# Patient Record
Sex: Female | Born: 1945 | Race: White | Hispanic: No | Marital: Married | Smoking: Former smoker
Health system: Southern US, Community
[De-identification: ages and names within clinical notes are randomized; demographics above are authoritative.]

## PROBLEM LIST (undated history)

## (undated) DIAGNOSIS — K5790 Diverticulosis of intestine, part unspecified, without perforation or abscess without bleeding: Secondary | ICD-10-CM

## (undated) DIAGNOSIS — I10 Essential (primary) hypertension: Secondary | ICD-10-CM

## (undated) DIAGNOSIS — R1900 Intra-abdominal and pelvic swelling, mass and lump, unspecified site: Secondary | ICD-10-CM

## (undated) DIAGNOSIS — K219 Gastro-esophageal reflux disease without esophagitis: Secondary | ICD-10-CM

## (undated) HISTORY — DX: Essential (primary) hypertension: I10

## (undated) HISTORY — DX: Gastro-esophageal reflux disease without esophagitis: K21.9

---
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
## 1971-02-25 HISTORY — PX: TUBAL LIGATION: SHX77

## 1985-02-24 HISTORY — PX: CHOLECYSTECTOMY: SHX55

## 1988-10-25 HISTORY — PX: HAND SURGERY: SHX662

## 1997-12-26 ENCOUNTER — Other Ambulatory Visit: Admission: RE | Admit: 1997-12-26 | Discharge: 1997-12-26 | Payer: Self-pay | Admitting: Gynecology

## 1999-05-20 ENCOUNTER — Other Ambulatory Visit: Admission: RE | Admit: 1999-05-20 | Discharge: 1999-05-20 | Payer: Self-pay | Admitting: Gynecology

## 2000-03-06 ENCOUNTER — Encounter: Admission: RE | Admit: 2000-03-06 | Discharge: 2000-03-06 | Payer: Self-pay | Admitting: Cardiovascular Disease

## 2000-03-06 ENCOUNTER — Encounter: Payer: Self-pay | Admitting: Cardiovascular Disease

## 2000-04-17 ENCOUNTER — Encounter: Payer: Self-pay | Admitting: Cardiovascular Disease

## 2000-04-17 ENCOUNTER — Encounter: Admission: RE | Admit: 2000-04-17 | Discharge: 2000-04-17 | Payer: Self-pay | Admitting: Cardiovascular Disease

## 2000-12-15 ENCOUNTER — Encounter: Payer: Self-pay | Admitting: Cardiovascular Disease

## 2000-12-15 ENCOUNTER — Encounter: Admission: RE | Admit: 2000-12-15 | Discharge: 2000-12-15 | Payer: Self-pay | Admitting: Cardiovascular Disease

## 2004-05-22 ENCOUNTER — Observation Stay (HOSPITAL_COMMUNITY): Admission: EM | Admit: 2004-05-22 | Discharge: 2004-05-23 | Payer: Self-pay | Admitting: Emergency Medicine

## 2004-06-13 ENCOUNTER — Encounter (HOSPITAL_COMMUNITY): Admission: RE | Admit: 2004-06-13 | Discharge: 2004-09-11 | Payer: Self-pay | Admitting: *Deleted

## 2010-03-17 ENCOUNTER — Encounter: Payer: Self-pay | Admitting: *Deleted

## 2015-09-14 ENCOUNTER — Ambulatory Visit: Payer: Medicare Other | Attending: Gynecology | Admitting: Gynecology

## 2015-09-14 ENCOUNTER — Other Ambulatory Visit (HOSPITAL_COMMUNITY)
Admission: RE | Admit: 2015-09-14 | Discharge: 2015-09-14 | Disposition: A | Discharge status: Home / Self Care | Payer: Medicare Other | Source: Ambulatory Visit | Attending: Gynecology | Admitting: Gynecology

## 2015-09-14 ENCOUNTER — Encounter: Payer: Self-pay | Admitting: Gynecology

## 2015-09-14 VITALS — BP 150/72 | HR 102 | Temp 97.5°F | Resp 18 | Ht 65.0 in | Wt 267.3 lb

## 2015-09-14 DIAGNOSIS — R102 Pelvic and perineal pain: Secondary | ICD-10-CM | POA: Diagnosis not present

## 2015-09-14 DIAGNOSIS — Z1151 Encounter for screening for human papillomavirus (HPV): Secondary | ICD-10-CM | POA: Insufficient documentation

## 2015-09-14 DIAGNOSIS — R1900 Intra-abdominal and pelvic swelling, mass and lump, unspecified site: Secondary | ICD-10-CM | POA: Insufficient documentation

## 2015-09-14 DIAGNOSIS — I10 Essential (primary) hypertension: Secondary | ICD-10-CM | POA: Diagnosis not present

## 2015-09-14 DIAGNOSIS — R87619 Unspecified abnormal cytological findings in specimens from cervix uteri: Secondary | ICD-10-CM | POA: Diagnosis not present

## 2015-09-14 DIAGNOSIS — Z01411 Encounter for gynecological examination (general) (routine) with abnormal findings: Secondary | ICD-10-CM | POA: Insufficient documentation

## 2015-09-14 DIAGNOSIS — Z87891 Personal history of nicotine dependence: Secondary | ICD-10-CM | POA: Insufficient documentation

## 2015-09-14 DIAGNOSIS — Z9049 Acquired absence of other specified parts of digestive tract: Secondary | ICD-10-CM | POA: Insufficient documentation

## 2015-09-14 DIAGNOSIS — R918 Other nonspecific abnormal finding of lung field: Secondary | ICD-10-CM | POA: Insufficient documentation

## 2015-09-14 DIAGNOSIS — K219 Gastro-esophageal reflux disease without esophagitis: Secondary | ICD-10-CM | POA: Diagnosis not present

## 2015-09-14 DIAGNOSIS — R590 Localized enlarged lymph nodes: Secondary | ICD-10-CM | POA: Insufficient documentation

## 2015-09-14 DIAGNOSIS — Z880 Allergy status to penicillin: Secondary | ICD-10-CM | POA: Insufficient documentation

## 2015-09-14 DIAGNOSIS — Z881 Allergy status to other antibiotic agents status: Secondary | ICD-10-CM | POA: Diagnosis not present

## 2015-09-14 DIAGNOSIS — Z809 Family history of malignant neoplasm, unspecified: Secondary | ICD-10-CM | POA: Insufficient documentation

## 2015-09-14 NOTE — Addendum Note (Signed)
Addended by: Elizebeth Koller on: 09/14/2015 01:54 PM   Modules accepted: Orders

## 2015-09-14 NOTE — Progress Notes (Signed)
Consult Note: Gyn-Onc   Catherine Russell 70 y.o. female  Chief Complaint  Patient presents with  . Pelvic mass in female    New consultation    Assessment :Advanced gynecologic cancer (diagnosis pending), moderate to severe pelvic pain.  Plan: I have reviewed the CT findings with the patient and her daughter. Pap smears obtained. We will schedule the patient to have interventional radiology obtain a core biopsy of the pelvic mass or other accessible masses such as lymph nodes or omentum. In the interim, to control pain, the patient interpretation for OxyContin 10 mg every 12 hours and oxycodone 5 mg every 6 hours when necessary pain.     HPI: 70 year old white female gravida 3 para 3 seen in consultation at the request of Dr. 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  The patient reports that she's had intermittent bleeding for the last several years. The last 5 months she has not had any bleeding. However, for the last several months she has had ever increasing pelvic pain. The patient was seen by gastroenterologist to obtained a CT scan of the abdomen and pelvis that showed significant abnormalities including a 14 x 10 cm central pelvic mass involving the uterus and extending into the right adnexa. In addition, the patient has retroperitoneal adenopathy in the periaortic and pelvic region. The largest lymph node is in the left para-aortic region measuring 3.4 cm. She has bilateral iliac lymphadenopathy with a right external iliac node measuring 2.3 cm. Further, the patient has several sub-centimeter pulmonary nodules in both lung bases the largest of which measures 7 mm. There is noted to be mild left hydronephrosis and diffuse right renal parenchymal atrophy. Tumor markers reveal an elevated CA will 125 252 units per mL and CEA of 37.6 units. The patient denies any past gynecologic history although she has not had much medical care in  a number of years.  Today she is not having any bleeding all a she seems to be in moderate to severe pain in the lower abdomen and pelvis. She comes accompanied by her youngest daughter.  Review of Systems:10 point review of systems is negative except as noted in interval history.   Vitals: Blood pressure 150/72, pulse 102, temperature 97.5 F (36.4 C), temperature source Oral, resp. rate 18, height 5\' 5"  (1.651 m), weight 267 lb 4.8 oz (121.246 kg), SpO2 97 %.  Physical Exam: General : The patient is in moderate distress from pain. She is tearful and rocking on the bed.Catherine Russell  HEENT: normocephalic, extraoccular movements normal; neck is supple without thyromegally  Lynphnodes: Supraclavicular and inguinal nodes not enlarged  Abdomen: Obese, Soft, no ascites, no organomegally, no masses, no hernias there is moderate discomfort to deep palpation in the lower abdomen. Pelvic:  EGBUS: Normal female  Vagina: Normal, no lesions  Urethra and Bladder: Normal, non-tender  Cervix: I am unable to visualize or palpate the cervix Uterus: Unable to assess secondary to the patient's habitus. Bi-manual examination: Non-tender; no adenxal masses or nodularity  Rectal: normal sphincter tone, no masses, no blood  Lower extremities: No edema or varicosities. Normal range of motion      Allergies  Allergen Reactions  . Doxycycline Other (See Comments)    Blisters  . Erythromycin Other (See Comments)    Blisters   . Penicillins Other (See Comments)    Blisters    Past Medical History  Diagnosis Date  . GERD (gastroesophageal reflux disease)   .  Hypertension     Past Surgical History  Procedure Laterality Date  . Appendectomy  1966  . Cholecystectomy  1987  . Tubal ligation  1973  . Hand surgery  1990's  . Tonsillectomy  1953    Current Outpatient Prescriptions  Medication Sig Dispense Refill  . meloxicam (MOBIC) 15 MG tablet Take 15 mg by mouth every other day.    Catherine Russell omeprazole  (PRILOSEC) 20 MG capsule Take 20 mg by mouth daily.    Catherine Russell oxyCODONE-acetaminophen (PERCOCET/ROXICET) 5-325 MG tablet Take 1 tablet by mouth every 4 (four) hours as needed for moderate pain or severe pain.    . valsartan-hydrochlorothiazide (DIOVAN-HCT) 320-25 MG tablet Take 1 tablet by mouth daily.     No current facility-administered medications for this visit.    Social History   Social History  . Marital Status: Married    Spouse Name: N/A  . Number of Children: N/A  . Years of Education: N/A   Occupational History  . Not on file.   Social History Main Topics  . Smoking status: Former Smoker    Quit date: 03/13/1995  . Smokeless tobacco: Not on file  . Alcohol Use: No  . Drug Use: No  . Sexual Activity: Not on file   Other Topics Concern  . Not on file   Social History Narrative  . No narrative on file    Family History  Problem Relation Age of Onset  . Cancer Mother   . Hypertension Mother   . Heart attack Mother   . Heart attack Father   . Cancer Brother   . Diabetes Brother   . Hypertension Brother   . Stroke Brother       Marti Sleigh, MD 09/14/2015, 1:13 PM

## 2015-09-14 NOTE — Patient Instructions (Signed)
You will receive a phone call from the Hospital to arrange for you to have a biopsy.  We will discuss a plan once we have the biopsy results.  Take the oxycontin every 12 hours and use the oxycodone immediate release as needed throughout the day.  Please call for any questions or concerns.

## 2015-09-17 ENCOUNTER — Telehealth: Payer: Self-pay

## 2015-09-17 NOTE — Telephone Encounter (Signed)
Patient's daughter Catherine Russell called to update Dr Marti Sleigh that her mother has been experiencing pain even though she had been taking Oxycotin 10 MG PO every 12 hours and Oxycodone 5 mg po every 6 hours PRN. States her mother initiated the Oxycontin 10 mg po every 12 hours Friday 7/21, 2017 evening. Catherine Russell, APNP updated , orders received to have the patient take two tablets of the Oxycodone 5 mg po every six hours , PRN and call tomorrow to update [place]. Catherine Russell was updated and states understanding , will call tomorrow to update on her mother's pain status. Catherine Russell instructed to call with any additional changes, questions or concerns.

## 2015-09-18 ENCOUNTER — Telehealth: Payer: Self-pay | Admitting: Gynecologic Oncology

## 2015-09-18 ENCOUNTER — Other Ambulatory Visit: Payer: Self-pay | Admitting: Gynecologic Oncology

## 2015-09-18 NOTE — Telephone Encounter (Signed)
Returned call to patient's daughter.  Left message asking her to please call the office so adjustments in her mother's pain medication can be discussed.

## 2015-09-19 ENCOUNTER — Other Ambulatory Visit: Payer: Self-pay | Admitting: General Surgery

## 2015-09-19 ENCOUNTER — Emergency Department (HOSPITAL_COMMUNITY): Payer: Medicare Other

## 2015-09-19 ENCOUNTER — Encounter (HOSPITAL_COMMUNITY): Payer: Self-pay

## 2015-09-19 ENCOUNTER — Inpatient Hospital Stay (HOSPITAL_COMMUNITY)
Admission: EM | Admit: 2015-09-19 | Discharge: 2015-09-21 | DRG: 683 | Disposition: A | Discharge status: Hospice | Payer: Medicare Other | Attending: Internal Medicine | Admitting: Internal Medicine

## 2015-09-19 DIAGNOSIS — Z6841 Body Mass Index (BMI) 40.0 and over, adult: Secondary | ICD-10-CM

## 2015-09-19 DIAGNOSIS — Z66 Do not resuscitate: Secondary | ICD-10-CM | POA: Diagnosis present

## 2015-09-19 DIAGNOSIS — N179 Acute kidney failure, unspecified: Principal | ICD-10-CM | POA: Diagnosis present

## 2015-09-19 DIAGNOSIS — C799 Secondary malignant neoplasm of unspecified site: Secondary | ICD-10-CM | POA: Diagnosis present

## 2015-09-19 DIAGNOSIS — R34 Anuria and oliguria: Secondary | ICD-10-CM | POA: Diagnosis present

## 2015-09-19 DIAGNOSIS — Z79899 Other long term (current) drug therapy: Secondary | ICD-10-CM

## 2015-09-19 DIAGNOSIS — E875 Hyperkalemia: Secondary | ICD-10-CM | POA: Diagnosis present

## 2015-09-19 DIAGNOSIS — R1900 Intra-abdominal and pelvic swelling, mass and lump, unspecified site: Secondary | ICD-10-CM | POA: Diagnosis present

## 2015-09-19 DIAGNOSIS — N19 Unspecified kidney failure: Secondary | ICD-10-CM

## 2015-09-19 DIAGNOSIS — Z880 Allergy status to penicillin: Secondary | ICD-10-CM

## 2015-09-19 DIAGNOSIS — Z823 Family history of stroke: Secondary | ICD-10-CM

## 2015-09-19 DIAGNOSIS — Z833 Family history of diabetes mellitus: Secondary | ICD-10-CM

## 2015-09-19 DIAGNOSIS — M7070 Other bursitis of hip, unspecified hip: Secondary | ICD-10-CM | POA: Diagnosis present

## 2015-09-19 DIAGNOSIS — N189 Chronic kidney disease, unspecified: Secondary | ICD-10-CM | POA: Diagnosis present

## 2015-09-19 DIAGNOSIS — Z8249 Family history of ischemic heart disease and other diseases of the circulatory system: Secondary | ICD-10-CM

## 2015-09-19 DIAGNOSIS — I129 Hypertensive chronic kidney disease with stage 1 through stage 4 chronic kidney disease, or unspecified chronic kidney disease: Secondary | ICD-10-CM | POA: Diagnosis present

## 2015-09-19 DIAGNOSIS — K219 Gastro-esophageal reflux disease without esophagitis: Secondary | ICD-10-CM

## 2015-09-19 DIAGNOSIS — Z87891 Personal history of nicotine dependence: Secondary | ICD-10-CM

## 2015-09-19 DIAGNOSIS — Z79891 Long term (current) use of opiate analgesic: Secondary | ICD-10-CM

## 2015-09-19 DIAGNOSIS — N390 Urinary tract infection, site not specified: Secondary | ICD-10-CM | POA: Diagnosis present

## 2015-09-19 DIAGNOSIS — Z809 Family history of malignant neoplasm, unspecified: Secondary | ICD-10-CM

## 2015-09-19 DIAGNOSIS — R531 Weakness: Secondary | ICD-10-CM | POA: Diagnosis not present

## 2015-09-19 DIAGNOSIS — Z9049 Acquired absence of other specified parts of digestive tract: Secondary | ICD-10-CM

## 2015-09-19 DIAGNOSIS — Z515 Encounter for palliative care: Secondary | ICD-10-CM | POA: Diagnosis present

## 2015-09-19 DIAGNOSIS — E669 Obesity, unspecified: Secondary | ICD-10-CM | POA: Diagnosis present

## 2015-09-19 DIAGNOSIS — C763 Malignant neoplasm of pelvis: Secondary | ICD-10-CM | POA: Diagnosis present

## 2015-09-19 DIAGNOSIS — Z881 Allergy status to other antibiotic agents status: Secondary | ICD-10-CM

## 2015-09-19 DIAGNOSIS — N1330 Unspecified hydronephrosis: Secondary | ICD-10-CM | POA: Diagnosis present

## 2015-09-19 HISTORY — DX: Diverticulosis of intestine, part unspecified, without perforation or abscess without bleeding: K57.90

## 2015-09-19 HISTORY — DX: Intra-abdominal and pelvic swelling, mass and lump, unspecified site: R19.00

## 2015-09-19 LAB — URINALYSIS, ROUTINE W REFLEX MICROSCOPIC
Bilirubin Urine: NEGATIVE
Glucose, UA: NEGATIVE mg/dL
Hgb urine dipstick: NEGATIVE
KETONES UR: NEGATIVE mg/dL
Nitrite: NEGATIVE
PROTEIN: NEGATIVE mg/dL
Specific Gravity, Urine: 1.015 (ref 1.005–1.030)
pH: 5 (ref 5.0–8.0)

## 2015-09-19 LAB — COMPREHENSIVE METABOLIC PANEL
ALT: 8 U/L — AB (ref 14–54)
AST: 16 U/L (ref 15–41)
Albumin: 3.4 g/dL — ABNORMAL LOW (ref 3.5–5.0)
Alkaline Phosphatase: 104 U/L (ref 38–126)
Anion gap: 15 (ref 5–15)
BILIRUBIN TOTAL: 0.7 mg/dL (ref 0.3–1.2)
BUN: 79 mg/dL — ABNORMAL HIGH (ref 6–20)
CALCIUM: 9.8 mg/dL (ref 8.9–10.3)
CHLORIDE: 104 mmol/L (ref 101–111)
CO2: 14 mmol/L — ABNORMAL LOW (ref 22–32)
CREATININE: 7.2 mg/dL — AB (ref 0.44–1.00)
GFR, EST AFRICAN AMERICAN: 6 mL/min — AB (ref >60)
GFR, EST NON AFRICAN AMERICAN: 5 mL/min — AB (ref >60)
Glucose, Bld: 107 mg/dL — ABNORMAL HIGH (ref 65–99)
Potassium: 5.6 mmol/L — ABNORMAL HIGH (ref 3.5–5.1)
Sodium: 133 mmol/L — ABNORMAL LOW (ref 135–145)
TOTAL PROTEIN: 7.3 g/dL (ref 6.5–8.1)

## 2015-09-19 LAB — CBC WITH DIFFERENTIAL/PLATELET
BASOS ABS: 0 10*3/uL (ref 0.0–0.1)
Basophils Relative: 0 %
EOS ABS: 0.1 10*3/uL (ref 0.0–0.7)
Eosinophils Relative: 1 %
HCT: 35 % — ABNORMAL LOW (ref 36.0–46.0)
Hemoglobin: 11.5 g/dL — ABNORMAL LOW (ref 12.0–15.0)
LYMPHS ABS: 0.7 10*3/uL (ref 0.7–4.0)
Lymphocytes Relative: 5 %
MCH: 29.8 pg (ref 26.0–34.0)
MCHC: 32.9 g/dL (ref 30.0–36.0)
MCV: 90.7 fL (ref 78.0–100.0)
MONO ABS: 1.3 10*3/uL — AB (ref 0.1–1.0)
Monocytes Relative: 9 %
NEUTROS PCT: 85 %
Neutro Abs: 12.5 10*3/uL — ABNORMAL HIGH (ref 1.7–7.7)
PLATELETS: 328 10*3/uL (ref 150–400)
RBC: 3.86 MIL/uL — AB (ref 3.87–5.11)
RDW: 14.7 % (ref 11.5–15.5)
WBC: 14.6 10*3/uL — AB (ref 4.0–10.5)

## 2015-09-19 LAB — URINE MICROSCOPIC-ADD ON: RBC / HPF: NONE SEEN RBC/hpf (ref 0–5)

## 2015-09-19 LAB — CYTOLOGY - PAP

## 2015-09-19 MED ORDER — SODIUM CHLORIDE 0.9% FLUSH
3.0000 mL | Freq: Two times a day (BID) | INTRAVENOUS | Status: DC
  Administered 2015-09-19: 3 mL via INTRAVENOUS

## 2015-09-19 MED ORDER — HEPARIN SODIUM (PORCINE) 5000 UNIT/ML IJ SOLN
5000.0000 [IU] | Freq: Three times a day (TID) | INTRAMUSCULAR | Status: DC
  Administered 2015-09-19 – 2015-09-21 (×4): 5000 [IU] via SUBCUTANEOUS
  Filled 2015-09-19 (×6): qty 1

## 2015-09-19 MED ORDER — OXYCODONE HCL 5 MG PO TABS
5.0000 mg | ORAL_TABLET | ORAL | Status: DC | PRN
  Administered 2015-09-19: 10 mg via ORAL
  Filled 2015-09-19 (×2): qty 2

## 2015-09-19 MED ORDER — CIPROFLOXACIN IN D5W 400 MG/200ML IV SOLN
400.0000 mg | INTRAVENOUS | Status: DC
  Administered 2015-09-19 – 2015-09-20 (×2): 400 mg via INTRAVENOUS
  Filled 2015-09-19 (×2): qty 200

## 2015-09-19 MED ORDER — ONDANSETRON HCL 4 MG/2ML IJ SOLN: 4.0000 mg | Freq: Four times a day (QID) | INTRAMUSCULAR | Status: DC | PRN

## 2015-09-19 MED ORDER — MORPHINE SULFATE (PF) 2 MG/ML IV SOLN
2.0000 mg | INTRAVENOUS | Status: DC | PRN
  Administered 2015-09-19 – 2015-09-20 (×3): 2 mg via INTRAVENOUS
  Filled 2015-09-19 (×3): qty 1

## 2015-09-19 MED ORDER — SODIUM CHLORIDE 0.9 % IV SOLN
INTRAVENOUS | Status: DC
  Administered 2015-09-19 – 2015-09-21 (×4): via INTRAVENOUS

## 2015-09-19 MED ORDER — OXYCODONE HCL ER 10 MG PO T12A
10.0000 mg | EXTENDED_RELEASE_TABLET | Freq: Two times a day (BID) | ORAL | Status: DC
  Administered 2015-09-19 – 2015-09-21 (×4): 10 mg via ORAL
  Filled 2015-09-19 (×4): qty 1

## 2015-09-19 MED ORDER — PROSIGHT PO TABS
1.0000 | ORAL_TABLET | Freq: Every day | ORAL | Status: DC
  Administered 2015-09-19 – 2015-09-21 (×3): 1 via ORAL
  Filled 2015-09-19 (×3): qty 1

## 2015-09-19 MED ORDER — SODIUM POLYSTYRENE SULFONATE 15 GM/60ML PO SUSP
15.0000 g | Freq: Once | ORAL | Status: AC
  Administered 2015-09-19: 15 g via ORAL
  Filled 2015-09-19: qty 60

## 2015-09-19 MED ORDER — PANTOPRAZOLE SODIUM 40 MG PO TBEC
40.0000 mg | DELAYED_RELEASE_TABLET | Freq: Every day | ORAL | Status: DC
  Administered 2015-09-19 – 2015-09-21 (×3): 40 mg via ORAL
  Filled 2015-09-19 (×3): qty 1

## 2015-09-19 MED ORDER — SODIUM CHLORIDE 0.9 % IV BOLUS (SEPSIS)
500.0000 mL | Freq: Once | INTRAVENOUS | Status: AC
  Administered 2015-09-19: 500 mL via INTRAVENOUS

## 2015-09-19 NOTE — H&P (Addendum)
History and Physical    JEUNE MUSLEH [id number] DOB: [birth year] DOA: 09/19/2015  PCP: No primary care provider on file.  Patient coming from: Home  Chief Complaint: poor oral intake, nausea in the mornings.  HPI: Catherine Russell is a 70 y.o. female with medical history significant of reflux and pelvic mass. Patient presents with two-week complaint of poor oral intake associated with nausea. The problem has been persistent and gradually getting worse. Nothing she is aware of makes it better or worse. Given persistence in symptoms patient presented to the ED for further evaluation. She also endorses weakness and was unable to get up out of the bathroom today.  ED Course: While in the ED patient was found to have elevated potassium of 5.6 and elevated serum creatinine of 7.2. As such we are consulted for evaluation of acute renal failure, leukocytosis and hyperkalemia  Review of Systems: As per HPI otherwise 10 point review of systems negative.   Past Medical History:  Diagnosis Date  . GERD (gastroesophageal reflux disease)   . Hypertension   . Pelvic mass     Past Surgical History:  Procedure Laterality Date  . APPENDECTOMY  1966  . CHOLECYSTECTOMY  1987  . HAND SURGERY  1990's  . TONSILLECTOMY  1953  . TUBAL LIGATION  1973     reports that she quit smoking about 20 years ago. She has never used smokeless tobacco. She reports that she does not drink alcohol or use drugs.  Allergies  Allergen Reactions  . Doxycycline Other (See Comments)    Blisters  . Erythromycin Other (See Comments)    Blisters   . Penicillins Hives, Itching and Other (See Comments)    Blisters Has patient had a PCN reaction causing immediate rash, facial/tongue/throat swelling, SOB or lightheadedness with hypotension: yes Has patient had a PCN reaction causing severe rash involving mucus membranes or skin necrosis: no Has patient had a PCN reaction that required hospitalization: yes, already at  hospital Has patient had a PCN reaction occurring within the last 10 years: no  If all of the above answers are "NO", then may proceed with Cephalosporin use.     Family History  Problem Relation Age of Onset  . Cancer Mother   . Hypertension Mother   . Heart attack Mother   . Cancer Brother   . Diabetes Brother   . Hypertension Brother   . Stroke Brother   . Heart attack Father     Prior to Admission medications   Medication Sig Start Date End Date Taking? Authorizing Provider  Menthol (ICY HOT BACK) 5 % PTCH Apply 2 patches topically at bedtime as needed (pain, place 1 patch on each leg).   Yes Historical Provider, MD  Menthol-Camphor (ICY HOT ADVANCED RELIEF) 16-11 % CREA Apply 1 application topically 2 (two) times daily as needed (on back and legs for pain).   Yes Historical Provider, MD  omeprazole (PRILOSEC) 20 MG capsule Take 20 mg by mouth daily.   Yes Historical Provider, MD  oxyCODONE (OXY IR/ROXICODONE) 5 MG immediate release tablet Take 5-10 mg by mouth every 4 (four) hours as needed for severe pain.   Yes Historical Provider, MD  oxyCODONE (OXYCONTIN) 10 mg 12 hr tablet Take 10 mg by mouth every 12 (twelve) hours.   Yes Historical Provider, MD  valsartan-hydrochlorothiazide (DIOVAN-HCT) 320-25 MG tablet Take 1 tablet by mouth daily.   Yes Historical Provider, MD    Physical Exam: Vitals:   09/19/15 1110 09/19/15  1200 09/19/15 1211 09/19/15 1212  BP:  161/69 154/71   Pulse:  103 106 107  Resp:  22 21 16   Temp: 98.5 F (36.9 C)     TempSrc: Oral     SpO2:  96% 97% 94%      Constitutional: NAD, calm, comfortable Vitals:   09/19/15 1110 09/19/15 1200 09/19/15 1211 09/19/15 1212  BP:  161/69 154/71   Pulse:  103 106 107  Resp:  22 21 16   Temp: 98.5 F (36.9 C)     TempSrc: Oral     SpO2:  96% 97% 94%   Eyes: PERRL, lids and conjunctivae normal ENMT: Mucous membranes are moist. Posterior pharynx clear of any exudate or lesions.Normal dentition.  Neck:  normal, supple, no masses, no thyromegaly Respiratory: clear to auscultation bilaterally, no wheezing, no crackles. Normal respiratory effort. No accessory muscle use.  Cardiovascular: Regular rate and rhythm, no murmurs / rubs / gallops. No extremity edema. 2+ pedal pulses. No carotid bruits.  Abdomen: Tenderness at lower quadrants bilaterally, no masses palpated, positive bowel sounds, no rebound tenderness Musculoskeletal: no clubbing / cyanosis. No joint deformity upper and lower extremities. Good ROM, no contractures. Normal muscle tone.  Skin: no rashes, lesions, ulcers. No induration Neurologic: Patient answers questions appropriately, moves extremities equally, sensation to light touch intact, no facial asymmetry Psychiatric: Normal judgment and insight. Alert and oriented x 3. Normal mood.    Labs on Admission: I have personally reviewed following labs and imaging studies  CBC:  Recent Labs Lab 09/19/15 0900  WBC 14.6*  NEUTROABS 12.5*  HGB 11.5*  HCT 35.0*  MCV 90.7  PLT [id number]   Basic Metabolic Panel:  Recent Labs Lab 09/19/15 1000  NA 133*  K 5.6*  CL 104  CO2 14*  GLUCOSE 107*  BUN 79*  CREATININE 7.20*  CALCIUM 9.8   GFR: Estimated Creatinine Clearance: 9.8 mL/min (by C-G formula based on SCr of 7.2 mg/dL). Liver Function Tests:  Recent Labs Lab 09/19/15 1000  AST 16  ALT 8*  ALKPHOS 104  BILITOT 0.7  PROT 7.3  ALBUMIN 3.4*   No results for input(s): LIPASE, AMYLASE in the last 168 hours. No results for input(s): AMMONIA in the last 168 hours. Coagulation Profile: No results for input(s): INR, PROTIME in the last 168 hours. Cardiac Enzymes: No results for input(s): CKTOTAL, CKMB, CKMBINDEX, TROPONINI in the last 168 hours. BNP (last 3 results) No results for input(s): PROBNP in the last 8760 hours. HbA1C: No results for input(s): HGBA1C in the last 72 hours. CBG: No results for input(s): GLUCAP in the last 168 hours. Lipid Profile: No  results for input(s): CHOL, HDL, LDLCALC, TRIG, CHOLHDL, LDLDIRECT in the last 72 hours. Thyroid Function Tests: No results for input(s): TSH, T4TOTAL, FREET4, T3FREE, THYROIDAB in the last 72 hours. Anemia Panel: No results for input(s): VITAMINB12, FOLATE, FERRITIN, TIBC, IRON, RETICCTPCT in the last 72 hours. Urine analysis:    Component Value Date/Time   COLORURINE YELLOW 09/19/2015 0951   APPEARANCEUR CLEAR 09/19/2015 0951   LABSPEC 1.015 09/19/2015 0951   PHURINE 5.0 09/19/2015 0951   GLUCOSEU NEGATIVE 09/19/2015 0951   HGBUR NEGATIVE 09/19/2015 0951   BILIRUBINUR NEGATIVE 09/19/2015 0951   KETONESUR NEGATIVE 09/19/2015 0951   PROTEINUR NEGATIVE 09/19/2015 0951   NITRITE NEGATIVE 09/19/2015 0951   LEUKOCYTESUR TRACE (A) 09/19/2015 0951   Sepsis Labs: !!!!!!!!!!!!!!!!!!!!!!!!!!!!!!!!!!!!!!!!!!!! @LABRCNTIP (procalcitonin:4,lacticidven:4) )No results found for this or any previous visit (from the past 240 hour(s)).   Radiological Exams  on Admission: Dg Chest 2 View  Result Date: 09/19/2015 CLINICAL DATA:  Cough and chest congestion. Recent diagnosis of uterine cancer. Increasing weakness. EXAM: CHEST  2 VIEW COMPARISON:  05/22/2004 FINDINGS: The heart size and mediastinal contours are within normal limits. Both lungs are clear. The visualized skeletal structures are unremarkable. IMPRESSION: Normal chest. Electronically Signed   By: Lorriane Shire M.D.   On: 09/19/2015 10:20   EKG: Independently reviewed. Sinus tachycardia with no ST elevations or depressions. Artifact  Assessment/Plan Active Problems:   ARF (acute renal failure) (HCC) - Most likely prerenal etiology - Will administer normal saline and reassess - Hold ARB  UTI - Will follow-up with urine culture most likely cause of leukocytosis  Nausea - Could be secondary to worsening ARF. Will treat supportively with antiemetics  Generalized weakness -Most likely secondary to acute medical problems listed above.  Will add multivitamin and have physical therapy evaluate patient.  Leukocytosis -Most likely secondary to UTI will cover with antibiotics    Hyperkalemia - Administer IVF's Addendum: Kayexalate    Pelvic mass - Was supposed to get biopsy tomorrow. Will have nursing assist with letting the interventional radiology team know patient is admitted    GERD (gastroesophageal reflux disease) - Stable continue PPI  DVT prophylaxis:  Heparin Code Status: DO NOT RESUSCITATE Family Communication:  Disposition Plan: Pending improvement in condition Consults called:None Admission status: observation   Velvet Bathe MD [facility] Pager [phone]  If 7PM-7AM, please contact night-coverage [email or web address] Password [facility]  09/19/2015, 12:40 PM

## 2015-09-19 NOTE — ED Notes (Signed)
Bed: WA04 Expected date:  Expected time:  Means of arrival:  Comments: 69 yo F  Increased weakness, uterine cancer

## 2015-09-19 NOTE — ED Notes (Signed)
Report given to Hannah

## 2015-09-19 NOTE — ED Notes (Signed)
Report given to Matt RN

## 2015-09-19 NOTE — ED Provider Notes (Signed)
[facility] DEPT Provider Note   CSN: [id number] Arrival date & time: 09/19/15  0710  First Provider Contact:  First MD Initiated Contact with Patient 09/19/15 [phone]        History   Chief Complaint Chief Complaint  Patient presents with  . Weakness    HPI  Catherine Russell is a 70 y.o. female who presents to the Emergency Department complaining of weakness. History is provided by the patient and her daughter. She is brought in today for progressive weakness has been worsening over the last 3 months. Today she was able to shuffle to the bathroom with her walker but she was unable to get up off the commode. She felt short of breath and generally weak, weakness worse in the lower extremities. She has been evaluated for lower pelvic pain that is been present for the last 3 months. A week ago she had an outpatient CT scan that showed pelvic mass with adenopathy and was referred to gynecology oncology. She is scheduled for a biopsy of the lesions tomorrow for diagnosis but there is concern for cancer. She is currently on short and long-acting pain relief for her pelvic pain but her pain continued to worsen despite her medications. She denies any fevers but does have cough productive of yellow and green sputum with progressive shortness of breath. She has decreased oral intake, decreased urinary output  The history is provided by the patient and a relative. No language interpreter was used.  Weakness     Past Medical History:  Diagnosis Date  . Diverticulosis   . GERD (gastroesophageal reflux disease)   . Hypertension   . Pelvic mass     Patient Active Problem List   Diagnosis Date Noted  . Hyperkalemia 09/19/2015  . ARF (acute renal failure) ([place]) 09/19/2015  . Pelvic mass 09/19/2015  . GERD (gastroesophageal reflux disease) 09/19/2015    Past Surgical History:  Procedure Laterality Date  . APPENDECTOMY  1966  . CHOLECYSTECTOMY  1987  . HAND SURGERY  1990's  . TONSILLECTOMY   1953  . TUBAL LIGATION  1973    OB History    No data available       Home Medications    Prior to Admission medications   Medication Sig Start Date End Date Taking? Authorizing Provider  Menthol (ICY HOT BACK) 5 % PTCH Apply 2 patches topically at bedtime as needed (pain, place 1 patch on each leg).   Yes Historical Provider, MD  Menthol-Camphor (ICY HOT ADVANCED RELIEF) 16-11 % CREA Apply 1 application topically 2 (two) times daily as needed (on back and legs for pain).   Yes Historical Provider, MD  omeprazole (PRILOSEC) 20 MG capsule Take 20 mg by mouth daily.   Yes Historical Provider, MD  oxyCODONE (OXY IR/ROXICODONE) 5 MG immediate release tablet Take 5-10 mg by mouth every 4 (four) hours as needed for severe pain.   Yes Historical Provider, MD  oxyCODONE (OXYCONTIN) 10 mg 12 hr tablet Take 10 mg by mouth every 12 (twelve) hours.   Yes Historical Provider, MD  valsartan-hydrochlorothiazide (DIOVAN-HCT) 320-25 MG tablet Take 1 tablet by mouth daily.   Yes Historical Provider, MD    Family History Family History  Problem Relation Age of Onset  . Cancer Mother   . Hypertension Mother   . Heart attack Mother   . Cancer Brother   . Diabetes Brother   . Hypertension Brother   . Stroke Brother   . Heart attack Father  Social History Social History  Substance Use Topics  . Smoking status: Former Smoker    Packs/day: 0.25    Types: Cigarettes    Quit date: 03/13/1995  . Smokeless tobacco: Never Used  . Alcohol use No     Allergies   Doxycycline; Erythromycin; and Penicillins   Review of Systems Review of Systems  Neurological: Positive for weakness.  All other systems reviewed and are negative.    Physical Exam Updated Vital Signs BP (!) 146/61 (BP Location: Left Wrist)   Pulse (!) 104   Temp 98.6 F (37 C) (Oral)   Resp 20   SpO2 97%   Physical Exam  Constitutional: She is oriented to person, place, and time. She appears well-developed and  well-nourished.  HENT:  Head: Normocephalic and atraumatic.  Cardiovascular: Regular rhythm.   No murmur heard. Tachycardic  Pulmonary/Chest: Effort normal and breath sounds normal. No respiratory distress.  Abdominal: Soft.  Moderate left lower quadrant tenderness, mild right lower quadrant tenderness, obese abdomen  Musculoskeletal: She exhibits no edema or tenderness.  Neurological: She is alert and oriented to person, place, and time.  Generalized weakness, sensation to light touch intact in all 4 extremities  Skin: Skin is warm and dry.  Psychiatric:  Anxious and tearful  Nursing note and vitals reviewed.    ED Treatments / Results  Labs (all labs ordered are listed, but only abnormal results are displayed) Labs Reviewed  COMPREHENSIVE METABOLIC PANEL - Abnormal; Notable for the following:       Result Value   Sodium 133 (*)    Potassium 5.6 (*)    CO2 14 (*)    Glucose, Bld 107 (*)    BUN 79 (*)    Creatinine, Ser 7.20 (*)    Albumin 3.4 (*)    ALT 8 (*)    GFR calc non Af Amer 5 (*)    GFR calc Af Amer 6 (*)    All other components within normal limits  CBC WITH DIFFERENTIAL/PLATELET - Abnormal; Notable for the following:    WBC 14.6 (*)    RBC 3.86 (*)    Hemoglobin 11.5 (*)    HCT 35.0 (*)    Neutro Abs 12.5 (*)    Monocytes Absolute 1.3 (*)    All other components within normal limits  URINALYSIS, ROUTINE W REFLEX MICROSCOPIC (NOT AT [facility]) - Abnormal; Notable for the following:    Leukocytes, UA TRACE (*)    All other components within normal limits  URINE MICROSCOPIC-ADD ON - Abnormal; Notable for the following:    Squamous Epithelial / LPF 0-5 (*)    Bacteria, UA FEW (*)    All other components within normal limits  URINE CULTURE    EKG  EKG Interpretation  Date/Time:  Wednesday September 19 2015 08:46:11 EDT Ventricular Rate:  102 PR Interval:    QRS Duration: 84 QT Interval:  327 QTC Calculation: 426 R Axis:   20 Text Interpretation:  Sinus  tachycardia Ventricular premature complex Low voltage, precordial leads Baseline wander in lead(s) V1 Confirmed by Hazle Coca [id number]) on 09/19/2015 9:47:28 AM       Radiology Dg Chest 2 View  Result Date: 09/19/2015 CLINICAL DATA:  Cough and chest congestion. Recent diagnosis of uterine cancer. Increasing weakness. EXAM: CHEST  2 VIEW COMPARISON:  05/22/2004 FINDINGS: The heart size and mediastinal contours are within normal limits. Both lungs are clear. The visualized skeletal structures are unremarkable. IMPRESSION: Normal chest. Electronically Signed   By:  Lorriane Shire M.D.   On: 09/19/2015 10:20  US Renal  Result Date: 09/19/2015 CLINICAL DATA:  Renal failure. EXAM: RENAL / URINARY TRACT ULTRASOUND COMPLETE COMPARISON:  None. FINDINGS: Right Kidney: Length: 11.1 cm. Echogenicity within normal limits. No mass or hydronephrosis visualized. Left Kidney: Length: 12.7 cm. Echogenicity within normal limits. No mass or hydronephrosis visualized. Bladder: Decompressed with a Foley catheter in place. IMPRESSION: Negative for hydronephrosis.  Negative exam. Electronically Signed   By: Inge Rise M.D.   On: 09/19/2015 12:51   Procedures Procedures (including critical care time)  Medications Ordered in ED Medications  oxyCODONE (Oxy IR/ROXICODONE) immediate release tablet 5-10 mg (10 mg Oral Given 09/19/15 1256)  oxyCODONE (OXYCONTIN) 12 hr tablet 10 mg (not administered)  pantoprazole (PROTONIX) EC tablet 40 mg (40 mg Oral Given 09/19/15 1544)  heparin injection 5,000 Units (not administered)  sodium chloride flush (NS) 0.9 % injection 3 mL (3 mLs Intravenous Given 09/19/15 1457)  0.9 %  sodium chloride infusion ( Intravenous New Bag/Given 09/19/15 1452)  ondansetron (ZOFRAN) injection 4 mg (not administered)  multivitamin (PROSIGHT) tablet 1 tablet (not administered)  ciprofloxacin (CIPRO) IVPB 400 mg (400 mg Intravenous Given 09/19/15 1453)  morphine 2 MG/ML injection 2 mg (2 mg Intravenous  Given 09/19/15 1453)  sodium chloride 0.9 % bolus 500 mL (0 mLs Intravenous Stopped 09/19/15 1210)  sodium polystyrene (KAYEXALATE) 15 GM/60ML suspension 15 g (15 g Oral Given 09/19/15 1544)     Initial Impression / Assessment and Plan / ED Course  I have reviewed the triage vital signs and the nursing notes.  Pertinent labs & imaging results that were available during my care of the patient were reviewed by me and considered in my medical decision making (see chart for details).  Clinical Course    Patient here with progressive weakness over the last several months, now unable to walk due to weakness. BMP demonstrates significant renal failure. She states she did have a kidney problem when she was seen at [place] but does not know how about her renal function was at that time. She had 125 mL of urine in her bladder on Foley catheter placement. Providing IV fluids given her decreased oral intake. Hospitalist consulted for admission for acute renal failure.   Final Clinical Impressions(s) / ED Diagnoses   Final diagnoses:  Acute renal failure, unspecified acute renal failure type [facility])    New Prescriptions Current Discharge Medication List       Quintella Reichert, MD 09/19/15 1646

## 2015-09-19 NOTE — ED Notes (Signed)
125cc urine output returned upon catheterization, Pt c/o increasing pelvic pain, Dr. Ralene Bathe notified, urine sample sent to lab.

## 2015-09-19 NOTE — ED Notes (Signed)
Pt unable to void for urine sample at this time. 

## 2015-09-19 NOTE — ED Notes (Signed)
Pt was notified about Urinalysis

## 2015-09-19 NOTE — Progress Notes (Signed)
Pharmacy Antibiotic Note  Catherine Russell is a 70 y.o. female with hx pelvic mass currently being w/u by GYN oncologist outpatient, presented to the ED on 09/19/2015 with c/o weakness. Trace of leukocytes noted in UA and wbc elevated at 14.6.  To start abx for suspected UTI.  - scr 7.20 (crcl <10)   Plan: - ciprofloxacin 400 mg IV q24h - monitor renal function and adjust dose if/when appropriate  ____________________   Temp (24hrs), Avg:98.2 F (36.8 C), Min:97.8 F (36.6 C), Max:98.5 F (36.9 C)   Recent Labs Lab 09/19/15 0900 09/19/15 1000  WBC 14.6*  --   CREATININE  --  7.20*    Estimated Creatinine Clearance: 9.8 mL/min (by C-G formula based on SCr of 7.2 mg/dL).    Allergies  Allergen Reactions  . Doxycycline Other (See Comments)    Blisters  . Erythromycin Other (See Comments)    Blisters   . Penicillins Hives, Itching and Other (See Comments)    Blisters Has patient had a PCN reaction causing immediate rash, facial/tongue/throat swelling, SOB or lightheadedness with hypotension: yes Has patient had a PCN reaction causing severe rash involving mucus membranes or skin necrosis: no Has patient had a PCN reaction that required hospitalization: yes, already at hospital Has patient had a PCN reaction occurring within the last 10 years: no  If all of the above answers are "NO", then may proceed with Cephalosporin use.      Thank you for allowing pharmacy to be a part of this patient's care.  Lynelle Doctor 09/19/2015 12:40 PM

## 2015-09-19 NOTE — ED Triage Notes (Signed)
Pt bib EMS c/o weakness onset 5 to 6 months ago, per patient's daughter the patient was seen by her PCP for it and was referred to an orthopedic MD and then a Gastroenterologist, pt had a CT scan  done last Monday 09/10/15 which revealed a mass, patient was then referred cancer MD--Dr. Fermin Schwab, she was seen by him on 09/14/15 and was diagnosed with a pelvic mass that she has an appointment to be biopsied tomorrow 09/20/15, pt has had worsening weakness, patient uses a walker for ambulation at home, this morning patient got up to go to the bathroom and was too weak to walk back to her room, also c/o lower abdomen pain 8/10 worse on left lower abdomen, pt has not been eating or drinking well for the past few months.

## 2015-09-20 ENCOUNTER — Ambulatory Visit (HOSPITAL_COMMUNITY): Payer: Medicare Other

## 2015-09-20 ENCOUNTER — Inpatient Hospital Stay (HOSPITAL_COMMUNITY): Payer: Medicare Other

## 2015-09-20 ENCOUNTER — Ambulatory Visit (HOSPITAL_COMMUNITY): Admission: RE | Admit: 2015-09-20 | Payer: Medicare Other | Source: Ambulatory Visit

## 2015-09-20 DIAGNOSIS — Z809 Family history of malignant neoplasm, unspecified: Secondary | ICD-10-CM | POA: Diagnosis not present

## 2015-09-20 DIAGNOSIS — N390 Urinary tract infection, site not specified: Secondary | ICD-10-CM | POA: Diagnosis present

## 2015-09-20 DIAGNOSIS — Z823 Family history of stroke: Secondary | ICD-10-CM | POA: Diagnosis not present

## 2015-09-20 DIAGNOSIS — Z8249 Family history of ischemic heart disease and other diseases of the circulatory system: Secondary | ICD-10-CM | POA: Diagnosis not present

## 2015-09-20 DIAGNOSIS — I129 Hypertensive chronic kidney disease with stage 1 through stage 4 chronic kidney disease, or unspecified chronic kidney disease: Secondary | ICD-10-CM | POA: Diagnosis present

## 2015-09-20 DIAGNOSIS — C763 Malignant neoplasm of pelvis: Secondary | ICD-10-CM | POA: Diagnosis present

## 2015-09-20 DIAGNOSIS — K219 Gastro-esophageal reflux disease without esophagitis: Secondary | ICD-10-CM | POA: Diagnosis not present

## 2015-09-20 DIAGNOSIS — E669 Obesity, unspecified: Secondary | ICD-10-CM | POA: Diagnosis present

## 2015-09-20 DIAGNOSIS — Z9049 Acquired absence of other specified parts of digestive tract: Secondary | ICD-10-CM | POA: Diagnosis not present

## 2015-09-20 DIAGNOSIS — R1900 Intra-abdominal and pelvic swelling, mass and lump, unspecified site: Secondary | ICD-10-CM | POA: Diagnosis not present

## 2015-09-20 DIAGNOSIS — N189 Chronic kidney disease, unspecified: Secondary | ICD-10-CM | POA: Diagnosis present

## 2015-09-20 DIAGNOSIS — Z6841 Body Mass Index (BMI) 40.0 and over, adult: Secondary | ICD-10-CM | POA: Diagnosis not present

## 2015-09-20 DIAGNOSIS — C799 Secondary malignant neoplasm of unspecified site: Secondary | ICD-10-CM | POA: Diagnosis present

## 2015-09-20 DIAGNOSIS — Z87891 Personal history of nicotine dependence: Secondary | ICD-10-CM | POA: Diagnosis not present

## 2015-09-20 DIAGNOSIS — Z66 Do not resuscitate: Secondary | ICD-10-CM | POA: Diagnosis present

## 2015-09-20 DIAGNOSIS — R531 Weakness: Secondary | ICD-10-CM | POA: Diagnosis present

## 2015-09-20 DIAGNOSIS — R34 Anuria and oliguria: Secondary | ICD-10-CM | POA: Diagnosis present

## 2015-09-20 DIAGNOSIS — E875 Hyperkalemia: Secondary | ICD-10-CM

## 2015-09-20 DIAGNOSIS — N1330 Unspecified hydronephrosis: Secondary | ICD-10-CM | POA: Diagnosis present

## 2015-09-20 DIAGNOSIS — N179 Acute kidney failure, unspecified: Principal | ICD-10-CM

## 2015-09-20 DIAGNOSIS — Z880 Allergy status to penicillin: Secondary | ICD-10-CM | POA: Diagnosis not present

## 2015-09-20 DIAGNOSIS — M7070 Other bursitis of hip, unspecified hip: Secondary | ICD-10-CM | POA: Diagnosis present

## 2015-09-20 DIAGNOSIS — Z833 Family history of diabetes mellitus: Secondary | ICD-10-CM | POA: Diagnosis not present

## 2015-09-20 DIAGNOSIS — Z881 Allergy status to other antibiotic agents status: Secondary | ICD-10-CM | POA: Diagnosis not present

## 2015-09-20 DIAGNOSIS — Z515 Encounter for palliative care: Secondary | ICD-10-CM | POA: Diagnosis present

## 2015-09-20 LAB — CBC
HCT: 32.9 % — ABNORMAL LOW (ref 36.0–46.0)
HEMOGLOBIN: 10.8 g/dL — AB (ref 12.0–15.0)
MCH: 29.7 pg (ref 26.0–34.0)
MCHC: 32.8 g/dL (ref 30.0–36.0)
MCV: 90.4 fL (ref 78.0–100.0)
PLATELETS: 354 10*3/uL (ref 150–400)
RBC: 3.64 MIL/uL — ABNORMAL LOW (ref 3.87–5.11)
RDW: 14.5 % (ref 11.5–15.5)
WBC: 12.9 10*3/uL — ABNORMAL HIGH (ref 4.0–10.5)

## 2015-09-20 LAB — BASIC METABOLIC PANEL
Anion gap: 13 (ref 5–15)
Anion gap: 12 (ref 5–15)
BUN: 88 mg/dL — ABNORMAL HIGH (ref 6–20)
BUN: 90 mg/dL — ABNORMAL HIGH (ref 6–20)
CALCIUM: 9.4 mg/dL (ref 8.9–10.3)
CALCIUM: 9.3 mg/dL (ref 8.9–10.3)
CO2: 14 mmol/L — AB (ref 22–32)
CO2: 14 mmol/L — AB (ref 22–32)
CREATININE: 7.33 mg/dL — AB (ref 0.44–1.00)
CREATININE: 7.34 mg/dL — AB (ref 0.44–1.00)
Chloride: 107 mmol/L (ref 101–111)
Chloride: 108 mmol/L (ref 101–111)
GFR calc Af Amer: 6 mL/min — ABNORMAL LOW (ref >60)
GFR calc non Af Amer: 5 mL/min — ABNORMAL LOW (ref >60)
GFR, EST AFRICAN AMERICAN: 6 mL/min — AB (ref >60)
GFR, EST NON AFRICAN AMERICAN: 5 mL/min — AB (ref >60)
GLUCOSE: 106 mg/dL — AB (ref 65–99)
GLUCOSE: 102 mg/dL — AB (ref 65–99)
Potassium: 5.4 mmol/L — ABNORMAL HIGH (ref 3.5–5.1)
Potassium: 5.5 mmol/L — ABNORMAL HIGH (ref 3.5–5.1)
Sodium: 134 mmol/L — ABNORMAL LOW (ref 135–145)
Sodium: 134 mmol/L — ABNORMAL LOW (ref 135–145)

## 2015-09-20 LAB — PROTIME-INR
INR: 1.2
PROTHROMBIN TIME: 15.2 s (ref 11.4–15.2)

## 2015-09-20 LAB — URINE CULTURE: CULTURE: NO GROWTH

## 2015-09-20 MED ORDER — DIPHENHYDRAMINE HCL 12.5 MG/5ML PO ELIX: 12.5000 mg | ORAL_SOLUTION | Freq: Four times a day (QID) | ORAL | Status: DC | PRN

## 2015-09-20 MED ORDER — SODIUM CHLORIDE 0.9% FLUSH: 9.0000 mL | INTRAVENOUS | Status: DC | PRN

## 2015-09-20 MED ORDER — HYDROMORPHONE 1 MG/ML IV SOLN
INTRAVENOUS | Status: DC
  Administered 2015-09-20: 2.3 mg via INTRAVENOUS
  Administered 2015-09-20: 0.9 mg via INTRAVENOUS
  Administered 2015-09-20: 13:00:00 via INTRAVENOUS
  Administered 2015-09-21: 1.5 mg via INTRAVENOUS
  Administered 2015-09-21: 0 mg via INTRAVENOUS
  Administered 2015-09-21: 1.2 mg via INTRAVENOUS
  Filled 2015-09-20: qty 25

## 2015-09-20 MED ORDER — NALOXONE HCL 0.4 MG/ML IJ SOLN: 0.4000 mg | INTRAMUSCULAR | Status: DC | PRN

## 2015-09-20 MED ORDER — ONDANSETRON HCL 4 MG/2ML IJ SOLN: 4.0000 mg | Freq: Four times a day (QID) | INTRAMUSCULAR | Status: DC | PRN

## 2015-09-20 MED ORDER — MORPHINE SULFATE 2 MG/ML IV SOLN: INTRAVENOUS | Status: DC

## 2015-09-20 MED ORDER — DIPHENHYDRAMINE HCL 50 MG/ML IJ SOLN: 12.5000 mg | Freq: Four times a day (QID) | INTRAMUSCULAR | Status: DC | PRN

## 2015-09-20 MED ORDER — SODIUM POLYSTYRENE SULFONATE 15 GM/60ML PO SUSP
30.0000 g | Freq: Once | ORAL | Status: AC
Start: 2015-09-20 — End: 2015-09-20
  Administered 2015-09-20: 30 g via RECTAL
  Filled 2015-09-20: qty 120

## 2015-09-20 MED ORDER — ONDANSETRON HCL 4 MG/2ML IJ SOLN
4.0000 mg | Freq: Four times a day (QID) | INTRAMUSCULAR | Status: DC | PRN
  Administered 2015-09-20 – 2015-09-21 (×2): 4 mg via INTRAVENOUS
  Filled 2015-09-20 (×2): qty 2

## 2015-09-20 NOTE — Consult Note (Addendum)
Renal Service Consult Note [facility]  Catherine Russell [id number] Sol Blazing Requesting Physician:  Dr. Lorin Mercy  Reason for Consult:  Acute renal failure HPI: The patient is a 70 y.o. year-old with hx of obesity, HTN recently evaluated for recurrent LLQ abd pain and was found to have a large pelvic mass. CT done on 7/21 showed "significant abnormalities including a 14 x 10 cm central pelvic mass involving the uterus and extending into the right adnexa. In addition, the patient has retroperitoneal adenopathy in the periaortic and pelvic region. The largest lymph node is in the left para-aortic region measuring 3.4 cm. She has bilateral iliac lymphadenopathy with a right external iliac node measuring 2.3 cm. Further, the patient has several sub-centimeter pulmonary nodules in both lung bases the largest of which measures 7 mm. There is noted to be mild left hydronephrosis and diffuse right renal parenchymal atrophy." This is per Dr. Zenaida Niece note, we don't have the actual images here.    Patient hasn't voided much the last few days.    Blood work from [facility] on 09/10/15 showed creatinine of 2.1 and BUN of 33.    Main c/o's are anorexia, n/v for the last month.  Lower abd pain for several week, left side. No fevers, chills.  No dysuria.  Has had hematuria, vs vaginal bleeding, off and on for " a while", she is not sure if this has been urinary bleeding vs vaginal.  No nsaids', no hx renal failure, although when they did her blood work on 7/21 before the CT scan somebody did say that her "kidney numbers were off", but didn't give any details.    Patient grew up in [place], went to [employer].  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  Her husband died in Jun 15, 2006, she has 3 adult children.  Smoked until 06/15/1995.  Has been having declining health the last 5-6 mos, hasn't drove the car for 6 weeks. IS very tired.     ROS  denies CP  no joint pain   no  HA  no blurry vision  no rash  no diarrhea  Past Medical History  Past Medical History:  Diagnosis Date  . Diverticulosis   . GERD (gastroesophageal reflux disease)   . Hypertension   . Pelvic mass    Past Surgical History  Past Surgical History:  Procedure Laterality Date  . APPENDECTOMY  1966  . CHOLECYSTECTOMY  1987  . HAND SURGERY  1990's  . TONSILLECTOMY  1953  . TUBAL LIGATION  1973   Family History  Family History  Problem Relation Age of Onset  . Cancer Mother   . Hypertension Mother   . Heart attack Mother   . Cancer Brother   . Diabetes Brother   . Hypertension Brother   . Stroke Brother   . Heart attack Father    Social History  reports that she quit smoking about 20 years ago. Her smoking use included Cigarettes. She smoked 0.25 packs per day. She has never used smokeless tobacco. She reports that she does not drink alcohol or use drugs. Allergies  Allergies  Allergen Reactions  . Doxycycline Other (See Comments)    Blisters  . Erythromycin Other (See Comments)    Blisters   . Penicillins Hives, Itching and Other (See Comments)    Blisters Has patient had a PCN reaction causing immediate rash, facial/tongue/throat swelling, SOB or lightheadedness with hypotension: yes Has patient had a PCN reaction causing  severe rash involving mucus membranes or skin necrosis: no Has patient had a PCN reaction that required hospitalization: yes, already at hospital Has patient had a PCN reaction occurring within the last 10 years: no  If all of the above answers are "NO", then may proceed with Cephalosporin use.    Home medications Prior to Admission medications   Medication Sig Start Date End Date Taking? Authorizing Provider  Menthol (ICY HOT BACK) 5 % PTCH Apply 2 patches topically at bedtime as needed (pain, place 1 patch on each leg).   Yes Historical Provider, MD  Menthol-Camphor (ICY HOT ADVANCED RELIEF) 16-11 % CREA Apply 1 application topically 2 (two)  times daily as needed (on back and legs for pain).   Yes Historical Provider, MD  omeprazole (PRILOSEC) 20 MG capsule Take 20 mg by mouth daily.   Yes Historical Provider, MD  oxyCODONE (OXY IR/ROXICODONE) 5 MG immediate release tablet Take 5-10 mg by mouth every 4 (four) hours as needed for severe pain.   Yes Historical Provider, MD  oxyCODONE (OXYCONTIN) 10 mg 12 hr tablet Take 10 mg by mouth every 12 (twelve) hours.   Yes Historical Provider, MD  valsartan-hydrochlorothiazide (DIOVAN-HCT) 320-25 MG tablet Take 1 tablet by mouth daily.   Yes Historical Provider, MD   Liver Function Tests  Recent Labs Lab 09/19/15 1000  AST 16  ALT 8*  ALKPHOS 104  BILITOT 0.7  PROT 7.3  ALBUMIN 3.4*   No results for input(s): LIPASE, AMYLASE in the last 168 hours. CBC  Recent Labs Lab 09/19/15 0900 09/20/15 0553  WBC 14.6* 12.9*  NEUTROABS 12.5*  --   HGB 11.5* 10.8*  HCT 35.0* 32.9*  MCV 90.7 90.4  PLT 328 [id number]   Basic Metabolic Panel  Recent Labs Lab 09/19/15 1000 09/20/15 0553 09/20/15 1352  NA 133* 134* 134*  K 5.6* 5.4* 5.5*  CL 104 107 108  CO2 14* 14* 14*  GLUCOSE 107* 106* 102*  BUN 79* 88* 90*  CREATININE 7.20* 7.33* 7.34*  CALCIUM 9.8 9.4 9.3   Iron/TIBC/Ferritin/ %Sat No results found for: IRON, TIBC, FERRITIN, IRONPCTSAT  Vitals:   09/19/15 2100 09/20/15 0500 09/20/15 1327 09/20/15 1452  BP: (!) 176/81 (!) 154/75  122/60  Pulse: (!) 114 100  97  Resp: 18 20 11 20   Temp: 98.5 F (36.9 C) 97.7 F (36.5 C)  98.8 F (37.1 C)  TempSrc: Oral Oral  Oral  SpO2: 98% 97% 99% 95%   Exam Gen obese, tired , not in distress No rash, cyanosis or gangrene Sclera anicteric, throat clear  No jvd or bruits Chest clear bilat RRR no MRG Abd soft no ascites +bs obese, tender LLQ GU deferred MS no joint effusions or deformity Ext no LE or UE edema / no wounds or ulcers Neuro is alert, Ox 3 , nf, gen'd weakness  Na 134  K 5.5  CO2 14  BUN 90  Creat 7.34  Glu 102 WBC  12k  Hb 10.8  plt 354 Renal US - R 11.7 / L 12.3 cm, no hydro CT abd/ pelvis 09/10/15 > "mild left hydronephrosis and diffuse right renal parenchymal atrophy", no images available  Assessment: 1.  Acute and/or CKD - creat 7 today, it was 2.0 ten days ago.  She looks dry and was on an ARB/ diuretic combination along w N/V.  Suspect A/CRF.   CT 6 days ago showed mild L hydro and US done here now shows no hydronephrosis.  She has a  known large L pelvic mass with abd LAN which is likely a malignancy.  With recent CT showing some L hydro she may be obstructed on the left.  Will get noncontrast CT here tonight.  Cont IVF, there is room for more volume. Do not recommend dialysis, she is a poor candidate.  Avoid nsaid's / ACEi/ ARB. Have d/w pt and her daughter.  2.  Abd pain / pelvic mass w LAN - prob malignancy, has not had bx yet 3.  Obesity  4.  HTN - ARB stopped, no ACEi/ ARB.    Plan - as above  Kelly Splinter MD [facility] pager [phone]    cell [phone] 09/20/2015, 2:58 PM

## 2015-09-20 NOTE — Progress Notes (Signed)
Patient ID: Catherine Russell, female   DOB: 02-Jul-1945, 70 y.o.   MRN: [id number] Aware of patient's admission.  I have discussed this case with Dr. Earleen Newport.  We will not plan to proceed with scheduled biopsy today as she has been admitted with acute renal failure secondary to dehydration.  Her biopsy can be rescheduled as an outpatient or we could potentially look at trying to do this as an inpatient once she improves.  Her outpatient order will not allow for a biopsy while here.  So in order for [place] to reconsider a biopsy while an inpatient a new order would need to be written.    Neva Ramaswamy E 8:22 AM 09/20/2015

## 2015-09-20 NOTE — Care Management Obs Status (Signed)
[facility] NOTIFICATION   Patient Details  Name: KHYLIE SCHARA MRN: [id number] Date of Birth: 1945/05/29   Medicare Observation Status Notification Given:  Yes    Dellie Catholic, RN 09/20/2015, 3:09 PM

## 2015-09-20 NOTE — Progress Notes (Signed)
PROGRESS NOTE    Catherine Russell  [id number] DOB: Dec 13, 1945 DOA: 09/19/2015 PCP: Enid Skeens., MD     Brief Narrative: HPI per Dr. Wendee Beavers: HPI: Catherine Russell is a 70 y.o. female with medical history significant of reflux and pelvic mass. Patient presents with two-week complaint of poor oral intake associated with nausea. The problem has been persistent and gradually getting worse. Nothing she is aware of makes it better or worse. Given persistence in symptoms patient presented to the ED for further evaluation. She also endorses weakness and was unable to get up out of the bathroom today.  ED Course: While in the ED patient was found to have elevated potassium of 5.6 and elevated serum creatinine of 7.2. As such we are consulted for evaluation of acute renal failure, leukocytosis and hyperkalemia  Patient was admitted for IVF hydration with the thought that this was AKI.  Her creatinine did not improve and in fact is up today to 7.3.  Dr. Fermin Schwab was contacted and he does not believe this is obstructive in nature and recommends nephrology consultation.    Assessment & Plan:   Active Problems:   Hyperkalemia   ARF (acute renal failure) (HCC)   Pelvic mass   GERD (gastroesophageal reflux disease)  Acute renal failure -This is likely subacute based on otherwise essentially normal electrolytes other than a mildly elevated K+ at 5.4. -It is difficult to imagine that this patient with a 14x11 cm lower pelvic mass does not have an obstructive uropathy as the cause -Did not improve with fluids given thus far; will increase rate to 150 cc/hr (initially 250 cc/hr, but given concern for an obstruction will be a little more judicious) -Will recheck BMP at 1300 -Holding ARB and other nephrotoxic medications -Nephrology consult requested, Dr. Jonnie Finner to see -She is DNR/DNI but would consider short- or long-term dialysis  Hyperkalemia -Unable to tolerate PO Kayexalate -Will give  retention enema  -Fortunately, even with no treatment and worsening renal failure, her hyperkalemia is stable and even slightly improved as of this AM  Pelvic mass -Needs either biopsy or resection ASAP -Patient is DNR/DNI but needs a tissue diagnosis to enable further planning -Will await further input from IR/nephrology/Gyn Onc -She is having significant and uncontrolled pain; will transition to Dilaudid PCA for improved pain control while undergoing evaluation  GERD -Stable on PPI  Possible UTI -Started on Cipro by ER/Dr. Wendee Beavers -Will continue for now   DVT prophylaxis:  Heparin Code Status: DO NOT RESUSCITATE Family Communication:  Disposition Plan: Pending improvement in condition Admission status: observation  Consultants:   [place]  Nephrology  Procedures:  None yet - ?IR-guided biopsy of pelvic mass   Antimicrobials:   Ciprofloxacin   Subjective: Patient is having ongoing poorly controlled pain.  Alert and able to answer questions but appears to prefer not to.  Objective: Vitals:   09/19/15 1423 09/19/15 2100 09/20/15 0500 09/20/15 1327  BP: (!) 146/61 (!) 176/81 (!) 154/75   Pulse: (!) 104 (!) 114 100   Resp: 20 18 20 11   Temp: 98.6 F (37 C) 98.5 F (36.9 C) 97.7 F (36.5 C)   TempSrc: Oral Oral Oral   SpO2: 97% 98% 97% 99%    Intake/Output Summary (Last 24 hours) at 09/20/15 1430 Last data filed at 09/20/15 1356  Gross per 24 hour  Intake                0 ml  Output  400 ml  Net             -400 ml   There were no vitals filed for this visit.  Examination:  General exam: Appears calm and intermittently very uncomfortable  Respiratory system: Clear to auscultation. Respiratory effort normal. Cardiovascular system: S1 & S2 heard, RRR. No JVD, murmurs, rubs, gallops or clicks. No pedal edema. Gastrointestinal system: Abdomen is nondistended, soft and nontender. No organomegaly or masses felt. Normal bowel sounds heard. Central  nervous system: Alert and oriented. No focal neurological deficits. Extremities: Symmetric 5 x 5 power. Skin: No rashes, lesions or ulcers Psychiatry: Judgement and insight appear normal. Mood & affect appropriate.     Data Reviewed: I have personally reviewed following labs and imaging studies  CBC:  Recent Labs Lab 09/19/15 0900 09/20/15 0553  WBC 14.6* 12.9*  NEUTROABS 12.5*  --   HGB 11.5* 10.8*  HCT 35.0* 32.9*  MCV 90.7 90.4  PLT 328 [id number]   Basic Metabolic Panel:  Recent Labs Lab 09/19/15 1000 09/20/15 0553  NA 133* 134*  K 5.6* 5.4*  CL 104 107  CO2 14* 14*  GLUCOSE 107* 106*  BUN 79* 88*  CREATININE 7.20* 7.33*  CALCIUM 9.8 9.4   GFR: Estimated Creatinine Clearance: 9.6 mL/min (by C-G formula based on SCr of 7.33 mg/dL). Liver Function Tests:  Recent Labs Lab 09/19/15 1000  AST 16  ALT 8*  ALKPHOS 104  BILITOT 0.7  PROT 7.3  ALBUMIN 3.4*   No results for input(s): LIPASE, AMYLASE in the last 168 hours. No results for input(s): AMMONIA in the last 168 hours. Coagulation Profile:  Recent Labs Lab 09/20/15 0553  INR 1.20   Cardiac Enzymes: No results for input(s): CKTOTAL, CKMB, CKMBINDEX, TROPONINI in the last 168 hours. BNP (last 3 results) No results for input(s): PROBNP in the last 8760 hours. HbA1C: No results for input(s): HGBA1C in the last 72 hours. CBG: No results for input(s): GLUCAP in the last 168 hours. Lipid Profile: No results for input(s): CHOL, HDL, LDLCALC, TRIG, CHOLHDL, LDLDIRECT in the last 72 hours. Thyroid Function Tests: No results for input(s): TSH, T4TOTAL, FREET4, T3FREE, THYROIDAB in the last 72 hours. Anemia Panel: No results for input(s): VITAMINB12, FOLATE, FERRITIN, TIBC, IRON, RETICCTPCT in the last 72 hours. Urine analysis:    Component Value Date/Time   COLORURINE YELLOW 09/19/2015 [place] 09/19/2015 0951   LABSPEC 1.015 09/19/2015 0951   PHURINE 5.0 09/19/2015 0951   GLUCOSEU  NEGATIVE 09/19/2015 0951   HGBUR NEGATIVE 09/19/2015 0951   BILIRUBINUR NEGATIVE 09/19/2015 0951   KETONESUR NEGATIVE 09/19/2015 0951   PROTEINUR NEGATIVE 09/19/2015 0951   NITRITE NEGATIVE 09/19/2015 0951   LEUKOCYTESUR TRACE (A) 09/19/2015 0951   Sepsis Labs: @LABRCNTIP (procalcitonin:4,lacticidven:4)  ) Recent Results (from the past 240 hour(s))  Urine culture     Status: None   Collection Time: 09/19/15  9:51 AM  Result Value Ref Range Status   Specimen Description URINE, RANDOM  Final   Special Requests NONE  Final   Culture NO GROWTH Performed at [facility]   Final   Report Status 09/20/2015 FINAL  Final         Radiology Studies: Dg Chest 2 View  Result Date: 09/19/2015 CLINICAL DATA:  Cough and chest congestion. Recent diagnosis of uterine cancer. Increasing weakness. EXAM: CHEST  2 VIEW COMPARISON:  05/22/2004 FINDINGS: The heart size and mediastinal contours are within normal limits. Both lungs are clear. The visualized skeletal  structures are unremarkable. IMPRESSION: Normal chest. Electronically Signed   By: Lorriane Shire M.D.   On: 09/19/2015 10:20  US Renal  Result Date: 09/19/2015 CLINICAL DATA:  Renal failure. EXAM: RENAL / URINARY TRACT ULTRASOUND COMPLETE COMPARISON:  None. FINDINGS: Right Kidney: Length: 11.1 cm. Echogenicity within normal limits. No mass or hydronephrosis visualized. Left Kidney: Length: 12.7 cm. Echogenicity within normal limits. No mass or hydronephrosis visualized. Bladder: Decompressed with a Foley catheter in place. IMPRESSION: Negative for hydronephrosis.  Negative exam. Electronically Signed   By: Inge Rise M.D.   On: 09/19/2015 12:51       Scheduled Meds: . ciprofloxacin  400 mg Intravenous Q24H  . heparin  5,000 Units Subcutaneous Q8H  . HYDROmorphone   Intravenous Q4H  . multivitamin  1 tablet Oral Daily  . oxyCODONE  10 mg Oral Q12H  . pantoprazole  40 mg Oral Daily  . sodium chloride flush  3 mL  Intravenous Q12H   Continuous Infusions: . sodium chloride 150 mL/hr at 09/20/15 1024     LOS: 0 days    Time spent: 50 minutes    Karmen Bongo, MD [facility]   If 7PM-7AM, please contact night-coverage [email or web address] Password [email or web address] 09/20/2015, 2:30 PM

## 2015-09-20 NOTE — Progress Notes (Signed)
PT Cancellation Note  Patient Details Name: Catherine Russell MRN: [id number] DOB: 05/06/1945   Cancelled Treatment:    Reason Eval/Treat Not Completed: Pain limiting ability to participate . PCA was just placed. Check back tomorrow.   Claretha Cooper 09/20/2015, 2:23 PM Tresa Endo PT [phone]

## 2015-09-21 LAB — CBC WITH DIFFERENTIAL/PLATELET
BASOS PCT: 0 %
Basophils Absolute: 0 10*3/uL (ref 0.0–0.1)
EOS ABS: 0.1 10*3/uL (ref 0.0–0.7)
Eosinophils Relative: 1 %
HEMATOCRIT: 34.1 % — AB (ref 36.0–46.0)
HEMOGLOBIN: 11.1 g/dL — AB (ref 12.0–15.0)
LYMPHS PCT: 11 %
Lymphs Abs: 1.4 10*3/uL (ref 0.7–4.0)
MCH: 29.6 pg (ref 26.0–34.0)
MCHC: 32.6 g/dL (ref 30.0–36.0)
MCV: 90.9 fL (ref 78.0–100.0)
MONOS PCT: 9 %
Monocytes Absolute: 1.1 10*3/uL — ABNORMAL HIGH (ref 0.1–1.0)
NEUTROS ABS: 10.1 10*3/uL — AB (ref 1.7–7.7)
Neutrophils Relative %: 79 %
Platelets: 342 10*3/uL (ref 150–400)
RBC: 3.75 MIL/uL — ABNORMAL LOW (ref 3.87–5.11)
RDW: 14.8 % (ref 11.5–15.5)
WBC: 12.7 10*3/uL — ABNORMAL HIGH (ref 4.0–10.5)

## 2015-09-21 LAB — BASIC METABOLIC PANEL
ANION GAP: 13 (ref 5–15)
BUN: 83 mg/dL — AB (ref 6–20)
CALCIUM: 9.4 mg/dL (ref 8.9–10.3)
CO2: 12 mmol/L — AB (ref 22–32)
Chloride: 110 mmol/L (ref 101–111)
Creatinine, Ser: 7.09 mg/dL — ABNORMAL HIGH (ref 0.44–1.00)
GFR calc Af Amer: 6 mL/min — ABNORMAL LOW (ref >60)
GFR calc non Af Amer: 5 mL/min — ABNORMAL LOW (ref >60)
GLUCOSE: 100 mg/dL — AB (ref 65–99)
Potassium: 5.3 mmol/L — ABNORMAL HIGH (ref 3.5–5.1)
Sodium: 135 mmol/L (ref 135–145)

## 2015-09-21 MED ORDER — ACETAMINOPHEN 650 MG RE SUPP: 650.0000 mg | Freq: Four times a day (QID) | RECTAL | Status: DC | PRN

## 2015-09-21 MED ORDER — HYDROMORPHONE HCL 1 MG/ML IJ SOLN
1.0000 mg | Freq: Once | INTRAMUSCULAR | Status: AC
Start: 2015-09-21 — End: 2015-09-21
  Administered 2015-09-21: 1 mg via INTRAVENOUS
  Filled 2015-09-21: qty 1

## 2015-09-21 MED ORDER — NALOXONE HCL 0.4 MG/ML IJ SOLN: 0.4000 mg | INTRAMUSCULAR | Status: DC | PRN

## 2015-09-21 MED ORDER — TEMAZEPAM 15 MG PO CAPS: 15.0000 mg | ORAL_CAPSULE | Freq: Every evening | ORAL | Status: DC | PRN

## 2015-09-21 MED ORDER — ONDANSETRON HCL 4 MG/2ML IJ SOLN: 4.0000 mg | Freq: Four times a day (QID) | INTRAMUSCULAR | Status: DC | PRN

## 2015-09-21 MED ORDER — SODIUM CHLORIDE 0.9 % IV SOLN: 250.0000 mL | INTRAVENOUS | Status: DC | PRN

## 2015-09-21 MED ORDER — SODIUM CHLORIDE 0.9% FLUSH: 3.0000 mL | INTRAVENOUS | Status: DC | PRN

## 2015-09-21 MED ORDER — SODIUM CHLORIDE 0.9% FLUSH: 9.0000 mL | INTRAVENOUS | Status: DC | PRN

## 2015-09-21 MED ORDER — HYDROMORPHONE 1 MG/ML IV SOLN: INTRAVENOUS | Status: DC

## 2015-09-21 MED ORDER — DIPHENHYDRAMINE HCL 50 MG/ML IJ SOLN: 12.5000 mg | Freq: Four times a day (QID) | INTRAMUSCULAR | Status: DC | PRN

## 2015-09-21 MED ORDER — DIPHENHYDRAMINE HCL 12.5 MG/5ML PO ELIX: 12.5000 mg | ORAL_SOLUTION | Freq: Four times a day (QID) | ORAL | Status: DC | PRN

## 2015-09-21 MED ORDER — HYDROMORPHONE HCL 2 MG/ML IJ SOLN: 2.0000 mg | Freq: Once | INTRAMUSCULAR | Status: AC

## 2015-09-21 MED ORDER — LORAZEPAM 2 MG/ML PO CONC: 1.0000 mg | ORAL | Status: DC | PRN

## 2015-09-21 MED ORDER — SODIUM CHLORIDE 0.9% FLUSH: 3.0000 mL | Freq: Two times a day (BID) | INTRAVENOUS | Status: DC

## 2015-09-21 MED ORDER — LIP MEDEX EX OINT
TOPICAL_OINTMENT | CUTANEOUS | Status: DC | PRN
  Filled 2015-09-21: qty 7

## 2015-09-21 MED ORDER — ACETAMINOPHEN 325 MG PO TABS: 650.0000 mg | ORAL_TABLET | Freq: Four times a day (QID) | ORAL | Status: DC | PRN

## 2015-09-21 MED ORDER — LORAZEPAM 1 MG PO TABS: 1.0000 mg | ORAL_TABLET | ORAL | Status: DC | PRN

## 2015-09-21 MED ORDER — LORAZEPAM 2 MG/ML IJ SOLN
1.0000 mg | INTRAMUSCULAR | Status: DC | PRN
Start: 2015-09-21 — End: 2015-09-21
  Administered 2015-09-21: 1 mg via INTRAVENOUS
  Filled 2015-09-21: qty 1

## 2015-09-21 MED ORDER — HYDROMORPHONE HCL 1 MG/ML IJ SOLN
1.0000 mg | Freq: Once | INTRAMUSCULAR | Status: AC
  Administered 2015-09-21: 1 mg via INTRAVENOUS
  Filled 2015-09-21: qty 1

## 2015-09-21 MED ORDER — ONDANSETRON HCL 4 MG/2ML IJ SOLN: 4.0000 mg | Freq: Once | INTRAMUSCULAR | Status: AC

## 2015-09-21 NOTE — Progress Notes (Signed)
Addendum:  After speaking with Drs. Hilliard as well as Dr. Alinda Money, we are all in agreement regarding the prognosis in this patient.  I went back to talk with the family and the patient.  Based on our discussion, will notify Dr. Alinda Money that urology consultation is no longer needed/requested.  Instead, the patient and family would like to proceed with transfer to inpatient hospice.  The patient's first preference is for the inpatient facility in [place], with [facility] as her second choice.  Will attempt to arrange this, possibly as soon as today.  Will also transition to comfort care orders.  Carlyon Shadow, M.D.

## 2015-09-21 NOTE — Progress Notes (Signed)
PROGRESS NOTE    Catherine Russell  [id number] DOB: [birth year] DOA: 09/19/2015 PCP: Enid Skeens., MD     Brief Narrative: HPI per Dr. Wendee Beavers: HPI: Catherine Ridl Bakeris a 70 y.o.femalewith medical history significant of reflux and pelvic mass. Patient presents with two-week complaint of poor oral intake associated with nausea. The problem has been persistent and gradually getting worse. Nothing she is aware of makes it better or worse. Given persistence in symptoms patient presented to the ED for further evaluation. She also endorses weakness and was unable to get up out of the bathroom today.  ED Course:While in the ED patient was found to have elevated potassium of 5.6 and elevated serum creatinine of 7.2. As such we are consulted for evaluation of acute renal failure, leukocytosis and hyperkalemia  Upon discussion with family yesterday, it seems that complaints date back about 4-5 months.  Patient initially with groin pain.  Saw PCM several times and tried various therapies for muscle strains and other MSK ailments.  Referred to orthopedics and diagnosed with hip bursitis.  Eventually with abdominal/pelvic pain and sent to GI.  GI suspected constipation but ordered CT which occurred last week.  Based on results and also elevated creatinine, patient referred to GYN who then referred to Dr. Fermin Schwab.    Patient was initially admitted for IVF hydration with the thought that this was AKI.  Her creatinine has minimally improved since admission and she is now over 5L positive in I/O balance.  Dr. Fermin Schwab was contacted and he does not believe this is obstructive in nature and recommended nephrology consultation. Nephrology has seen patient and believes that there is a urologic obstruction and recommends urology consultation.  Overnight CT also ordered by nephrology which shows progression of metastatic disease, new and enlarging pulmonary nodules, worsening LAD, possible hepatic involvement  with mild left hydroureteronephrosis and right kidney atrophy.  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 Fermin Schwab, but suspect that patient is too fragile for such an extensive surgery and she is likely terminal -Will request palliative care consult, I called and spoke with them and they will see patient today -Dilaudid PCA started yesterday with improved pain control.  Acute on chronic renal failure -Despite aggressive hydration yesterday, she is oliguric and is now 5L positive -Will decrease or stop IVF at this point while determining the best way forward -Creatinine with minimal improvement despite aggressive hydration -CT shows essentially a solitary kidney (functionally) with mild hydronephrosis -Urology consult requested, although as above, this is likely terminal for her -Long discussion with patient's daughter.  She is very appropriate, but grieving appropriately.  This was slow in developing but rapid in diagnosing and so is a lot to process.  She is in agreement with palliative care consult and may consider comfort care and even transition to inpatient Hospice if this is what is recommended to her.  She still has several consultants to provide input (urology, gyn onc, and palliative care) prior to needing to make decisions, but she is coming to terms with the situation and is coping well given the circumstances. -Appreciate nephrology input  Hyperkalemia -Improved.  No longer of serious concern at this time.   -Will await decision to recheck based on consultant input about prognosis.  DVT prophylaxis:Heparin Code Status:DO NOT RESUSCITATE Family Communication: Long discussion with daughter as above Disposition Plan: To be  determined Admission status: Changed to admission, ongoing telemetry for hyperkalemia and PCA but may discontinue based on discussions today or tomorrow  Consultants:   [place]  Nephrology  Urology  Palliative care  Procedures:  None yet - ?IR-guided biopsy of pelvic mass   Antimicrobials:  ? Ciprofloxacin Subjective: Improved pain control with Dilaudid PCA.  No current concerns.   Objective: Vitals:   09/20/15 2345 09/21/15 0151 09/21/15 0507 09/21/15 0800  BP:  (!) 117/56 (!) 116/54   Pulse:  100 (!) 101   Resp: 14 12 10  (!) 8  Temp:  98.3 F (36.8 C) 97.9 F (36.6 C)   TempSrc:  Oral Oral   SpO2:  99% 100% 99%  Weight:   125.6 kg (276 lb 12.8 oz)   Height:   5\' 5"  (1.651 m)     Intake/Output Summary (Last 24 hours) at 09/21/15 0909 Last data filed at 09/21/15 0700  Gross per 24 hour  Intake          5203.33 ml  Output              600 ml  Net          4603.33 ml   Filed Weights   09/21/15 0507  Weight: 125.6 kg (276 lb 12.8 oz)    Examination:  General exam: Appears calm and comfortable  Respiratory system: Clear to auscultation. Respiratory effort normal. Cardiovascular system: S1 & S2 heard, RRR. No JVD, murmurs, rubs, gallops or clicks. No pedal edema. Gastrointestinal system: Abdomen is nondistended, soft and tender in the mid-lower abdomen; uterus is palpable to about 4 cm below umbilicus. Normal bowel sounds heard. Central nervous system: Somnolent.  No focal neurological deficits. Extremities: Symmetric 5 x 5 power. Skin: No rashes, lesions or ulcers Psychiatry: Judgement and insight appear normal. Quiet and keeps eyes open but does not interact a lot.  Does seem to follow conversation, however.    Data Reviewed: I have personally reviewed following labs and imaging studies  CBC:  Recent Labs Lab 09/19/15 0900 09/20/15 0553 09/21/15 0742  WBC 14.6* 12.9* 12.7*  NEUTROABS 12.5*  --  10.1*  HGB 11.5* 10.8* 11.1*  HCT 35.0* 32.9*  34.1*  MCV 90.7 90.4 90.9  PLT 328 354 [id number]   Basic Metabolic Panel:  Recent Labs Lab 09/19/15 1000 09/20/15 0553 09/20/15 1352 09/21/15 0742  NA 133* 134* 134* 135  K 5.6* 5.4* 5.5* 5.3*  CL 104 107 108 110  CO2 14* 14* 14* 12*  GLUCOSE 107* 106* 102* 100*  BUN 79* 88* 90* 83*  CREATININE 7.20* 7.33* 7.34* 7.09*  CALCIUM 9.8 9.4 9.3 9.4   GFR: Estimated Creatinine Clearance: 10.1 mL/min (by C-G formula based on SCr of 7.09 mg/dL). Liver Function Tests:  Recent Labs Lab 09/19/15 1000  AST 16  ALT 8*  ALKPHOS 104  BILITOT 0.7  PROT 7.3  ALBUMIN 3.4*   No results for input(s): LIPASE, AMYLASE in the last 168 hours. No results for input(s): AMMONIA in the last 168 hours. Coagulation Profile:  Recent Labs Lab 09/20/15 0553  INR 1.20   Cardiac Enzymes: No results for input(s): CKTOTAL, CKMB, CKMBINDEX, TROPONINI in the last 168 hours. BNP (last 3 results) No results for input(s): PROBNP in the last 8760 hours. HbA1C: No results for input(s): HGBA1C in the last 72 hours. CBG: No results for input(s): GLUCAP in  the last 168 hours. Lipid Profile: No results for input(s): CHOL, HDL, LDLCALC, TRIG, CHOLHDL, LDLDIRECT in the last 72 hours. Thyroid Function Tests: No results for input(s): TSH, T4TOTAL, FREET4, T3FREE, THYROIDAB in the last 72 hours. Anemia Panel: No results for input(s): VITAMINB12, FOLATE, FERRITIN, TIBC, IRON, RETICCTPCT in the last 72 hours. Urine analysis:    Component Value Date/Time   COLORURINE YELLOW 09/19/2015 [place] 09/19/2015 0951   LABSPEC 1.015 09/19/2015 0951   PHURINE 5.0 09/19/2015 0951   GLUCOSEU NEGATIVE 09/19/2015 0951   HGBUR NEGATIVE 09/19/2015 0951   BILIRUBINUR NEGATIVE 09/19/2015 0951   KETONESUR NEGATIVE 09/19/2015 0951   PROTEINUR NEGATIVE 09/19/2015 0951   NITRITE NEGATIVE 09/19/2015 0951   LEUKOCYTESUR TRACE (A) 09/19/2015 0951   Sepsis  Labs: @LABRCNTIP (procalcitonin:4,lacticidven:4)  ) Recent Results (from the past 240 hour(s))  Urine culture     Status: None   Collection Time: 09/19/15  9:51 AM  Result Value Ref Range Status   Specimen Description URINE, RANDOM  Final   Special Requests NONE  Final   Culture NO GROWTH Performed at [facility]   Final   Report Status 09/20/2015 FINAL  Final         Radiology Studies: Dg Chest 2 View  Result Date: 09/19/2015 CLINICAL DATA:  Cough and chest congestion. Recent diagnosis of uterine cancer. Increasing weakness. EXAM: CHEST  2 VIEW COMPARISON:  05/22/2004 FINDINGS: The heart size and mediastinal contours are within normal limits. Both lungs are clear. The visualized skeletal structures are unremarkable. IMPRESSION: Normal chest. Electronically Signed   By: Lorriane Shire M.D.   On: 09/19/2015 10:20  US Renal  Result Date: 09/19/2015 CLINICAL DATA:  Renal failure. EXAM: RENAL / URINARY TRACT ULTRASOUND COMPLETE COMPARISON:  None. FINDINGS: Right Kidney: Length: 11.1 cm. Echogenicity within normal limits. No mass or hydronephrosis visualized. Left Kidney: Length: 12.7 cm. Echogenicity within normal limits. No mass or hydronephrosis visualized. Bladder: Decompressed with a Foley catheter in place. IMPRESSION: Negative for hydronephrosis.  Negative exam. Electronically Signed   By: Inge Rise M.D.   On: 09/19/2015 12:51       Scheduled Meds: . ciprofloxacin  400 mg Intravenous Q24H  . heparin  5,000 Units Subcutaneous Q8H  . HYDROmorphone   Intravenous Q4H  . multivitamin  1 tablet Oral Daily  . oxyCODONE  10 mg Oral Q12H  . pantoprazole  40 mg Oral Daily  . sodium chloride flush  3 mL Intravenous Q12H   Continuous Infusions: . sodium chloride 150 mL/hr at 09/21/15 0114     LOS: 1 day    Time spent: 50 minutes    Karmen Bongo, MD [facility]   If 7PM-7AM, please contact night-coverage [email or web address] Password [email or web address] 09/21/2015,  9:09 AM

## 2015-09-21 NOTE — Progress Notes (Signed)
   [facility] Progress Note   Subjective: on PCA now, sleepy  Vitals:   09/20/15 2229 09/20/15 2345 09/21/15 0151 09/21/15 0507  BP: 117/78  (!) 117/56 (!) 116/54  Pulse: 99  100 (!) 101  Resp: 16 14 12 10   Temp: 98.7 F (37.1 C)  98.3 F (36.8 C) 97.9 F (36.6 C)  TempSrc: Oral  Oral Oral  SpO2: 100%  99% 100%  Weight:    125.6 kg (276 lb 12.8 oz)  Height:    5\' 5"  (1.651 m)    Inpatient medications: . ciprofloxacin  400 mg Intravenous Q24H  . heparin  5,000 Units Subcutaneous Q8H  . HYDROmorphone   Intravenous Q4H  . multivitamin  1 tablet Oral Daily  . oxyCODONE  10 mg Oral Q12H  . pantoprazole  40 mg Oral Daily  . sodium chloride flush  3 mL Intravenous Q12H   . sodium chloride 150 mL/hr at 09/21/15 0114   diphenhydrAMINE **OR** diphenhydrAMINE, naloxone **AND** sodium chloride flush, ondansetron (ZOFRAN) IV, oxyCODONE  Exam: Gen obese, tired , not in distress No jvd or bruits Chest clear bilat RRR no MRG Abd soft no ascites +bs obese, tender LLQ Ext no LE or UE edema Neuro is alert, Ox 3 , nf, gen'd weakness  Na 134  K 5.5  CO2 14  BUN 90  Creat 7.34  Glu 102 WBC 12k  Hb 10.8  plt 354 Renal US - R 11.7 / L 12.3 cm, no hydro CT abd/ pelvis 09/10/15 > "mild left hydronephrosis and diffuse right renal parenchymal atrophy", no images available CT abd / pelv 7/27 > not read yet but does have L hydroureter and markedly atrophic/ shrunken R kidney  Assessment: 1.  Acute and/or CKD - creat 7, it was 2.0 ten days ago.  Repeat CT shows obstruction on L and non-functional markedly atrophic/ shrunken R kidney.  Also was dry on ARB, getting IVF w improved UOP but creat not much better. Not a candidate for dialysis.  Have d/w primary MD, plan is for palliative care/ hospice transition. Will sign off.      2.  Obesity  3.  HTN - ARB stopped, no ACEi/ ARB going forward.  BP's soft.   4.  Pelvic mass - suspected GYN malignancy, has large pelvic mass involving  the uterus, sig lymph node involvement in upper abdomen and pulm nodules.     Plan - as above   Kelly Splinter MD [facility] pager [phone]    cell [phone] 09/21/2015, 8:41 AM    Recent Labs Lab 09/20/15 0553 09/20/15 1352 09/21/15 0742  NA 134* 134* 135  K 5.4* 5.5* 5.3*  CL 107 108 110  CO2 14* 14* 12*  GLUCOSE 106* 102* 100*  BUN 88* 90* 83*  CREATININE 7.33* 7.34* 7.09*  CALCIUM 9.4 9.3 9.4    Recent Labs Lab 09/19/15 1000  AST 16  ALT 8*  ALKPHOS 104  BILITOT 0.7  PROT 7.3  ALBUMIN 3.4*    Recent Labs Lab 09/19/15 0900 09/20/15 0553 09/21/15 0742  WBC 14.6* 12.9* 12.7*  NEUTROABS 12.5*  --  10.1*  HGB 11.5* 10.8* 11.1*  HCT 35.0* 32.9* 34.1*  MCV 90.7 90.4 90.9  PLT 328 354 342   Iron/TIBC/Ferritin/ %Sat No results found for: IRON, TIBC, FERRITIN, IRONPCTSAT

## 2015-09-21 NOTE — Discharge Summary (Signed)
Physician Discharge Summary  Catherine Russell [id number] DOB: 12-15-1945 DOA: 09/19/2015  PCP: Enid Skeens., MD  Admit date: 09/19/2015 Discharge date: 09/21/2015  Admitted From: Home Disposition:  [place]: No Equipment/Devices: No  Discharge Condition: Hospice CODE STATUS: DNR Diet recommendation: As tolerated Brief/Interim Summary: HPI per Dr. Wendee Beavers: HPI: Catherine Trillo Bakeris a 70 y.o.femalewith medical history significant of reflux and pelvic mass. Patient presents with two-week complaint of poor oral intake associated with nausea. The problem has been persistent and gradually getting worse. Nothing she is aware of makes it better or worse. Given persistence in symptoms patient presented to the ED for further evaluation. She also endorses weakness and was unable to get up out of the bathroom today.  ED Course:While in the ED patient was found to have elevated potassium of 5.6 and elevated serum creatinine of 7.2. As such we are consulted for evaluation of acute renal failure, leukocytosis and hyperkalemia  Upon discussion with family yesterday, it seems that complaints date back about 4-5 months.  Patient initially with groin pain.  Saw PCM several times and tried various therapies for muscle strains and other MSK ailments.  Referred to orthopedics and diagnosed with hip bursitis.  Eventually with abdominal/pelvic pain and sent to GI.  GI suspected constipation but ordered CT which occurred last week.  Based on results and also elevated creatinine, patient referred to GYN who then referred to Dr. Fermin Schwab.    Patient was initially admitted for IVF hydration with the thought that this was AKI. Her creatinine has minimally improved since admission and she is now over 5L positive in I/O balance. Dr. Fermin Schwab was contacted and he does not believe this is obstructive in nature and recommended nephrology consultation. Nephrology has seen patient and  believes that there is a urologic obstruction and recommends urology consultation.  Overnight CT also ordered by nephrology which shows progression of metastatic disease, new and enlarging pulmonary nodules, worsening LAD, possible hepatic involvement with mild left hydroureteronephrosis and right kidney atrophy.  Based on discussions with Drs. 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  As such, they are in agreement with transition to inpatient Hospice.  They prefer the facility in [place] because it is closer to the patient's home, but are willing to go to there or to [facility].  The LCSW is working on facilitating placement for today, if possible.    Discharge Diagnoses:  Active Problems:   Hyperkalemia   ARF (acute renal failure) (HCC)   Pelvic mass   GERD (gastroesophageal reflux disease)  Pelvic mass -By all accounts, this appears to be a rapidly progressive metastatic GYN malignancy -After discussion with Dr. Fermin Schwab, we are in agreement that patient is too fragile for such an extensive surgery and she is terminal -Will request palliative care consult, I called and spoke with them and they will see patient today; they may be unable to see patient prior to discharge if hospice transfer can be arranged sooner -Dilaudid PCA started yesterday with improved pain control. -Will transition to the comfort care order set for now -Continue Dialudid PCA until just prior to transport -Dilaudid 2 mg IV 10-15 minutes prior to transport with Zofran 4 mg IV -Transfer to inpatient hospice in [facility]) today  Acute on chronic renal failure -Despite aggressive hydration yesterday, she is oliguric and is now 5L  positive -Will stop IVF at this point  -Creatinine with minimal improvement despite  aggressive hydration -CT shows essentially a solitary kidney (functionally) with mild hydronephrosis -Urology consult requested, although as above, this isterminal for her; as such, after family discussion, urology consult was cancelled -Long discussion with patient's daughter.  She is very appropriate, but grieving appropriately.  This was slow in developing but rapid in diagnosing and so is a lot to process.  She is in agreement with  transition to inpatient Hospice and she is coming to terms with the situation and is coping well given the circumstances. -Appreciate nephrology input.  Hyperkalemia -Improved.  No longer of serious concern at this time.   -Will not plan to perform further blood draws.   Discharge Instructions  You have a terminal condition that has been causing your symptoms.  You are being transferred to inpatient hospice to provide comfort measures as the disease progresses. Discharge Instructions    Bed rest    Complete by:  As directed   Care order/instruction    Complete by:  As directed   Continue Dilaudid PCA until just prior to transport; give patient Dilaudid IV 15 minutes after discontinuation of PCA and just prior to transport   Diet general    Complete by:  As directed   Discharge instructions    Complete by:  As directed   You have a terminal condition that has been causing your symptoms.  You are being transferred to inpatient hospice to provide comfort measures as the disease progresses.       Medication List    STOP taking these medications   ICY HOT ADVANCED RELIEF 16-11 % Crea Generic drug:  Menthol-Camphor   ICY HOT BACK 5 % Ptch Generic drug:  Menthol   omeprazole 20 MG capsule Commonly known as:  PRILOSEC   oxyCODONE 10 mg 12 hr tablet Commonly known as:  OXYCONTIN   oxyCODONE 5 MG immediate release tablet Commonly known as:  Oxy IR/ROXICODONE   valsartan-hydrochlorothiazide 320-25 MG tablet Commonly known as:  DIOVAN-HCT        Allergies  Allergen Reactions  . Doxycycline Other (See Comments)    Blisters  . Erythromycin Other (See Comments)    Blisters   . Penicillins Hives, Itching and Other (See Comments)    Blisters Has patient had a PCN reaction causing immediate rash, facial/tongue/throat swelling, SOB or lightheadedness with hypotension: yes Has patient had a PCN reaction causing severe rash involving mucus membranes or skin necrosis: no Has patient had a PCN reaction that required hospitalization: yes, already at hospital Has patient had a PCN reaction occurring within the last 10 years: no  If all of the above answers are "NO", then may proceed with Cephalosporin use.     Consultations:  Nephrology   Procedures/Studies: Ct Abdomen Pelvis Wo Contrast  Result Date: 09/21/2015 CLINICAL DATA:  Acute renal failure. EXAM: CT ABDOMEN AND PELVIS WITHOUT CONTRAST TECHNIQUE: Multidetector CT imaging of the abdomen and pelvis was performed following the standard protocol without IV contrast. COMPARISON:  Ultrasound 09/19/2015 and CT 09/10/2015 FINDINGS: Lower chest: 9 x 7 mm pleural-based nodule in the right lower lobe on sequence 4, image 12. This is new from the recent comparison CT. Enlargement of the nodule at the right lung base measuring 8 mm on sequence 4, image 34 and previously measured 6 mm. Nodule in the medial left lower lobe on sequence 4, image 18 measures 9 x 9 mm and previously measured up to  7 mm. Additional nodules in the medial right lung base have enlarged. No significant pleural fluid. Enlarging nodule in left lower lobe on image 10. Hepatobiliary: New perihepatic ascites. Evaluation of liver is limited without intravenous contrast. However, there is a subtle area of low density along the medial right hepatic lobe on sequence 2, image 20. Findings raise concern for an underlying lesion. Cannot exclude another subtle low-density area in the liver on image 18. Gallbladder has been removed. No  significant biliary dilatation. Pancreas: Normal appearance of the pancreas without inflammation or duct dilatation. Spleen: Normal appearance of spleen without enlargement. Adrenals/Urinary Tract: Normal adrenal glands. Chronic atrophy of the right kidney. Again noted is fullness of the left renal collecting system. Minimal change from the prior CT. Left ureter is difficult to follow but there is mild dilatation of the left ureter and there may be a transition point in the pelvis related to the pelvic mass. Stomach/Bowel: High-density contrast in the colon. No evidence for bowel obstruction. Vascular/Lymphatic: Few atherosclerotic calcifications in the abdominal aorta. No evidence for an aneurysm. Again noted is extensive retroperitoneal lymphadenopathy surrounding the aorta and inferior vena cava. Left aortic lymph node on sequence 2, image 45 measures 4.1 x 3.4 cm, previously measured 3.4 x 3.2 cm. Index node in the right iliac region measures 2.6 cm in the short axis on sequence 2, image 57 and previously measured 2.4 cm. Large lymph node along the right pelvic sidewall measures 2.5 cm in the short axis and previously measured 2.3 cm. Enlarged periumbilical nodule. This is suggestive for a Sister Wynona Dove nodule which measures 2.9 x 2.5 cm. Left iliac lymph node on sequence 2, image 72 measures 2.4 cm in the short axis and previously measured 2.3 cm. Reproductive: Again noted is a large, poorly defined mass in the expected location of the uterus. Mass roughly measures 15.1 cm in transverse dimension and measures 10.8 cm in the AP dimension. The mass itself has not significantly changed from the prior CT. Other: Small amount of perihepatic fluid is new. There appears to be a small amount of fluid in the cul-de-sac. Again noted are scattered peritoneal implants. Largest peritoneal lesion is in the anterior abdomen measuring 3.4 x 4.4 cm and previously measured 2.9 x 3.5 cm. Musculoskeletal: No acute  abnormality. IMPRESSION: Progression of metastatic disease. New and enlarging pulmonary nodules. Enlarging abdominal and pelvic lymphadenopathy and enlargement of peritoneal implants. Concern for hepatic lesions. Again noted is a large, poorly defined pelvic mass which may be involving the uterus and adnexal structures. Findings are suspicious for primary GYN malignancy. The periumbilical nodule would be the most accessible lesion for tissue sampling if needed. Persistent mild left hydroureteronephrosis which appears to be secondary to the pelvic mass and lymphadenopathy. Consider renal decompression with a ureter stent or nephrostomy tube because the patient has essentially a solitary kidney. The right kidney is atrophic. These results will be called to the ordering clinician or representative by the Radiologist Assistant, and communication documented in the PACS or zVision Dashboard. Electronically Signed   By: Markus Daft M.D.   On: 09/21/2015 09:07  Dg Chest 2 View  Result Date: 09/19/2015 CLINICAL DATA:  Cough and chest congestion. Recent diagnosis of uterine cancer. Increasing weakness. EXAM: CHEST  2 VIEW COMPARISON:  05/22/2004 FINDINGS: The heart size and mediastinal contours are within normal limits. Both lungs are clear. The visualized skeletal structures are unremarkable. IMPRESSION: Normal chest. Electronically Signed   By: Lorriane Shire M.D.   On:  09/19/2015 10:20  US Renal  Result Date: 09/19/2015 CLINICAL DATA:  Renal failure. EXAM: RENAL / URINARY TRACT ULTRASOUND COMPLETE COMPARISON:  None. FINDINGS: Right Kidney: Length: 11.1 cm. Echogenicity within normal limits. No mass or hydronephrosis visualized. Left Kidney: Length: 12.7 cm. Echogenicity within normal limits. No mass or hydronephrosis visualized. Bladder: Decompressed with a Foley catheter in place. IMPRESSION: Negative for hydronephrosis.  Negative exam. Electronically Signed   By: Inge Rise M.D.   On: 09/19/2015  12:51    Subjective:   Discharge Exam: Vitals:   09/21/15 1156 09/21/15 1344  BP:  (!) 144/67  Pulse:  (!) 104  Resp: 13 17  Temp:  98.6 F (37 C)   Vitals:   09/21/15 0800 09/21/15 0900 09/21/15 1156 09/21/15 1344  BP:  (!) 120/52  (!) 144/67  Pulse:  (!) 112  (!) 104  Resp: (!) 8 16 13 17   Temp:  98 F (36.7 C)  98.6 F (37 C)  TempSrc:  Oral  Oral  SpO2: 99% 99% 97% 98%  Weight:      Height:        General: Pt is alert, awake, not in acute distress Cardiovascular: RRR, S1/S2 +, no rubs, no gallops Respiratory: CTA bilaterally, no wheezing, no rhonchi Abdominal: Soft, NT, ND, bowel sounds + Extremities: no edema, no cyanosis    The results of significant diagnostics from this hospitalization (including imaging, microbiology, ancillary and laboratory) are listed below for reference.     Microbiology: Recent Results (from the past 240 hour(s))  Urine culture     Status: None   Collection Time: 09/19/15  9:51 AM  Result Value Ref Range Status   Specimen Description URINE, RANDOM  Final   Special Requests NONE  Final   Culture NO GROWTH Performed at [facility]   Final   Report Status 09/20/2015 FINAL  Final     Labs: BNP (last 3 results) No results for input(s): BNP in the last 8760 hours. Basic Metabolic Panel:  Recent Labs Lab 09/19/15 1000 09/20/15 0553 09/20/15 1352 09/21/15 0742  NA 133* 134* 134* 135  K 5.6* 5.4* 5.5* 5.3*  CL 104 107 108 110  CO2 14* 14* 14* 12*  GLUCOSE 107* 106* 102* 100*  BUN 79* 88* 90* 83*  CREATININE 7.20* 7.33* 7.34* 7.09*  CALCIUM 9.8 9.4 9.3 9.4   Liver Function Tests:  Recent Labs Lab 09/19/15 1000  AST 16  ALT 8*  ALKPHOS 104  BILITOT 0.7  PROT 7.3  ALBUMIN 3.4*   No results for input(s): LIPASE, AMYLASE in the last 168 hours. No results for input(s): AMMONIA in the last 168 hours. CBC:  Recent Labs Lab 09/19/15 0900 09/20/15 0553 09/21/15 0742  WBC 14.6* 12.9* 12.7*  NEUTROABS  12.5*  --  10.1*  HGB 11.5* 10.8* 11.1*  HCT 35.0* 32.9* 34.1*  MCV 90.7 90.4 90.9  PLT 328 354 342   Cardiac Enzymes: No results for input(s): CKTOTAL, CKMB, CKMBINDEX, TROPONINI in the last 168 hours. BNP: Invalid input(s): POCBNP CBG: No results for input(s): GLUCAP in the last 168 hours. D-Dimer No results for input(s): DDIMER in the last 72 hours. Hgb A1c No results for input(s): HGBA1C in the last 72 hours. Lipid Profile No results for input(s): CHOL, HDL, LDLCALC, TRIG, CHOLHDL, LDLDIRECT in the last 72 hours. Thyroid function studies No results for input(s): TSH, T4TOTAL, T3FREE, THYROIDAB in the last 72 hours.  Invalid input(s): FREET3 Anemia work up No results for input(s):  VITAMINB12, FOLATE, FERRITIN, TIBC, IRON, RETICCTPCT in the last 72 hours. Urinalysis    Component Value Date/Time   COLORURINE YELLOW 09/19/2015 0951   APPEARANCEUR CLEAR 09/19/2015 0951   LABSPEC 1.015 09/19/2015 0951   PHURINE 5.0 09/19/2015 0951   GLUCOSEU NEGATIVE 09/19/2015 0951   HGBUR NEGATIVE 09/19/2015 0951   BILIRUBINUR NEGATIVE 09/19/2015 0951   KETONESUR NEGATIVE 09/19/2015 0951   PROTEINUR NEGATIVE 09/19/2015 0951   NITRITE NEGATIVE 09/19/2015 0951   LEUKOCYTESUR TRACE (A) 09/19/2015 0951   Sepsis Labs Invalid input(s): PROCALCITONIN,  WBC,  LACTICIDVEN Microbiology Recent Results (from the past 240 hour(s))  Urine culture     Status: None   Collection Time: 09/19/15  9:51 AM  Result Value Ref Range Status   Specimen Description URINE, RANDOM  Final   Special Requests NONE  Final   Culture NO GROWTH Performed at [facility]   Final   Report Status 09/20/2015 FINAL  Final     Time coordinating discharge: Over 30 minutes  SIGNED:   Karmen Bongo, MD  [facility] 09/21/2015, 3:37 PM   If 7PM-7AM, please contact night-coverage [email or web address] Password [email or web address]

## 2015-09-21 NOTE — Progress Notes (Signed)
CSW has contacted [facility] in [place] about bed availability. CSW is waiting for call about bed options. Please consult CSW for discharging planing.   Kingsley Spittle, [place] Clinical Social Worker [phone]

## 2015-09-21 NOTE — Progress Notes (Signed)
Patient is set to discharge to [facility] today. Patient & family aware. Discharge packet given to RN, Katharine Look. RN will call for PTAR.   Kingsley Spittle, [place] Clinical Social Worker [phone]

## 2015-10-26 DEATH — deceased

## 2018-02-12 IMAGING — CT CT ABD-PELV W/O CM
2 of 4 series · 15 of 46 positions shown, 17 images · non-contrast
Comparison: Ultrasound 09/19/2015 and CT 09/10/2015

CLINICAL DATA: Acute renal failure.

EXAM:
CT ABDOMEN AND PELVIS WITHOUT CONTRAST
TECHNIQUE: Multidetector CT imaging of the abdomen and pelvis was performed
following the standard protocol without IV contrast.

[Series 2: rtn a/p w/o · axial · non-contrast · 0.91mm/px · z∈[-534,-58]mm · 12 of 107 slices shown, 14 images]
[im 6/107  soft-tissue]
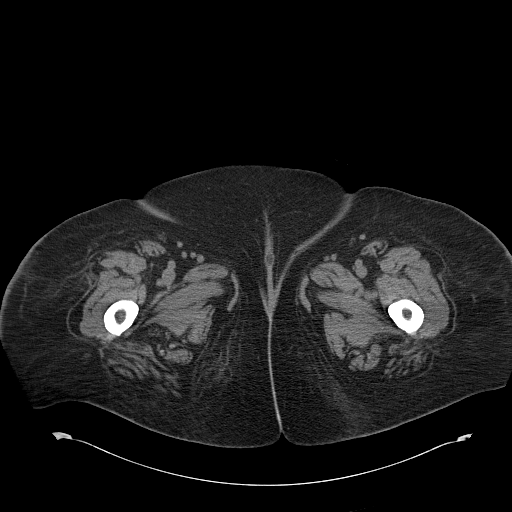
[im 6/107  bone]
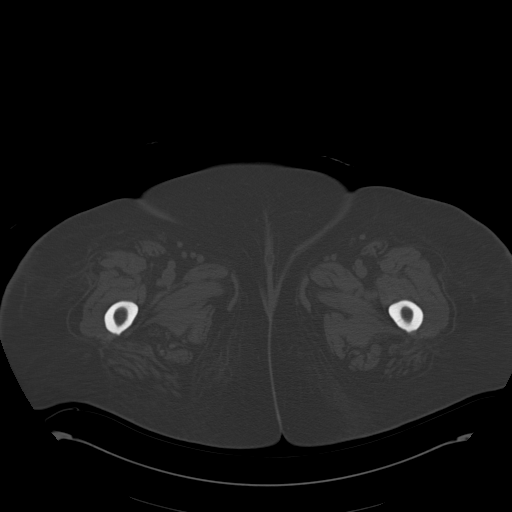
[im 16/107  soft-tissue]
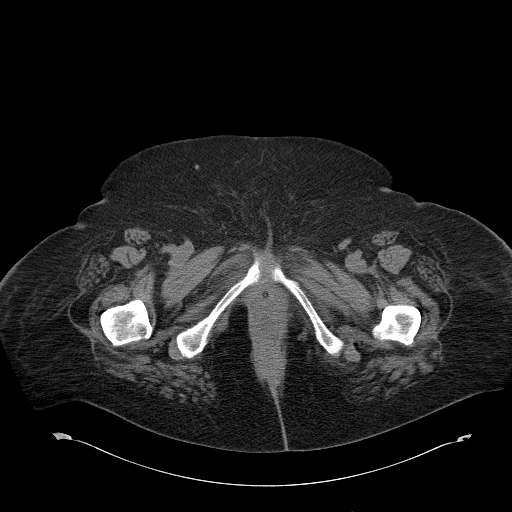
[im 22/107  soft-tissue]
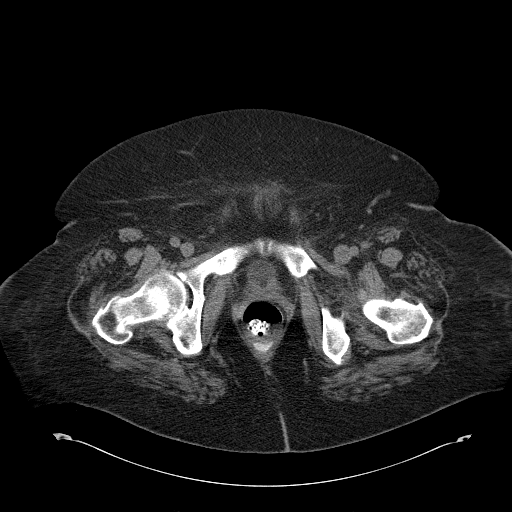
[im 32/107  soft-tissue]
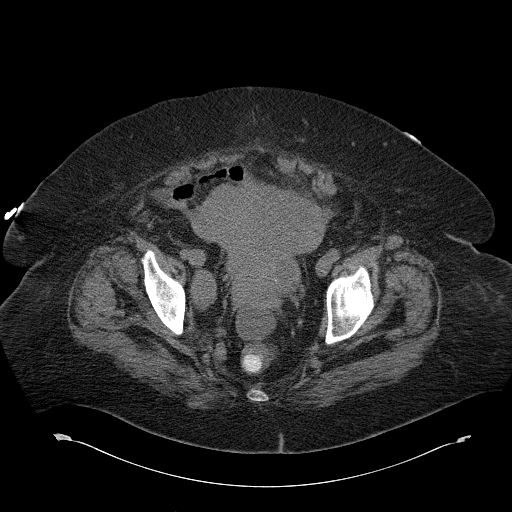
[im 43/107  soft-tissue]
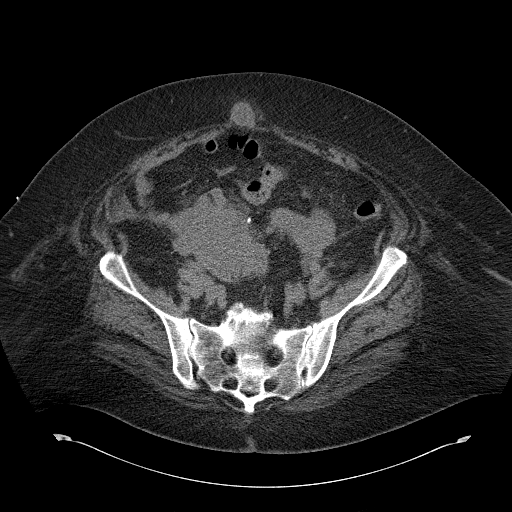
[im 48/107  soft-tissue]
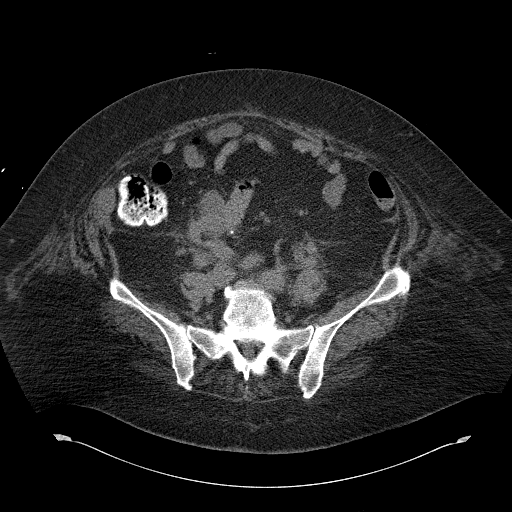
[im 59/107  soft-tissue]
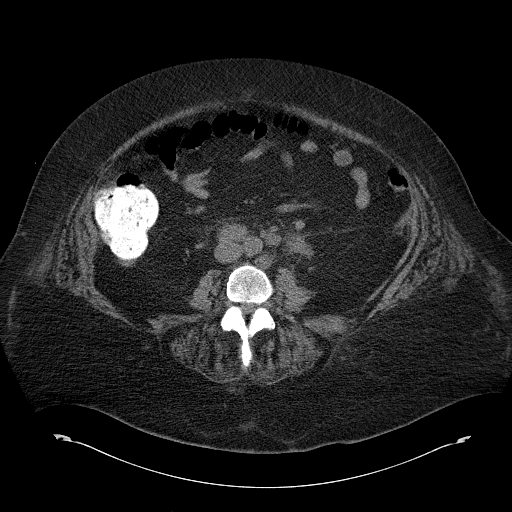
[im 64/107  soft-tissue]
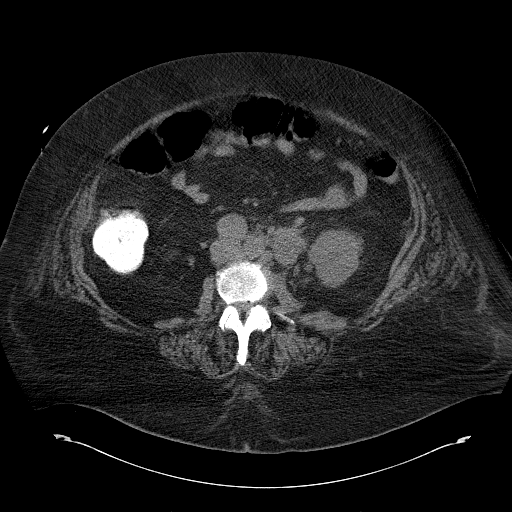
[im 75/107  soft-tissue]
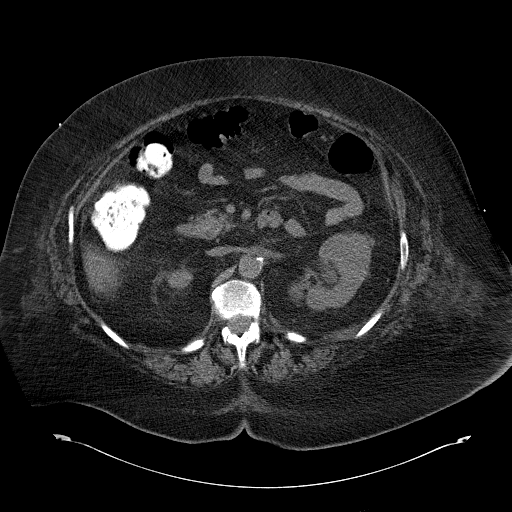
[im 75/107  bone]
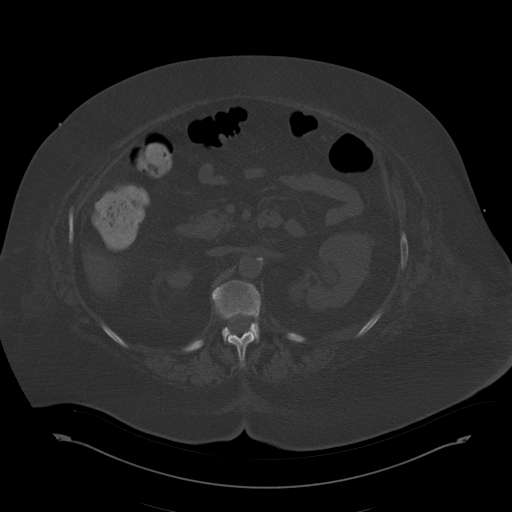
[im 85/107  soft-tissue]
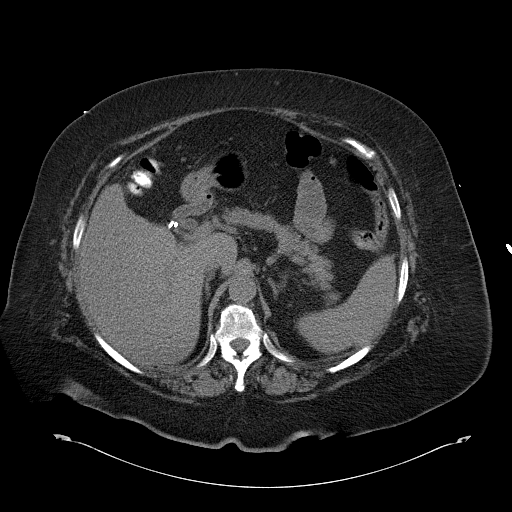
[im 91/107  soft-tissue]
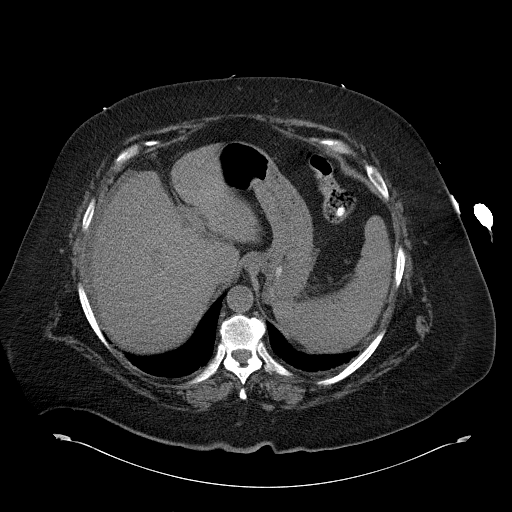
[im 101/107  soft-tissue]
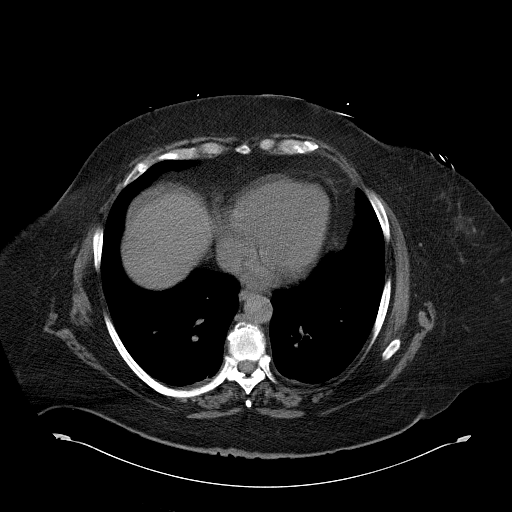

[Series 602: <mpr thick range> · coronal · 1.04mm/px · 3 of 177 slices shown]
[im 59/177  soft-tissue]
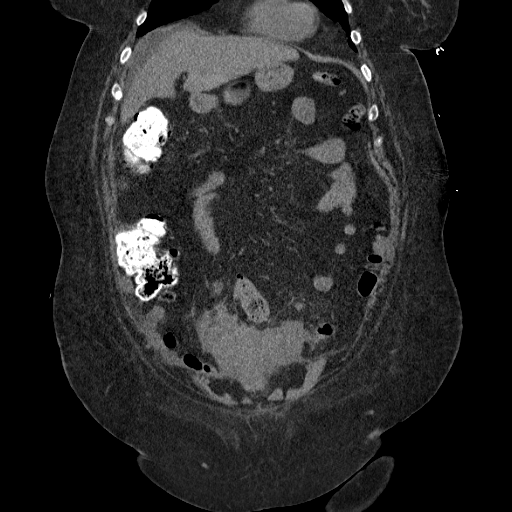
[im 79/177  soft-tissue]
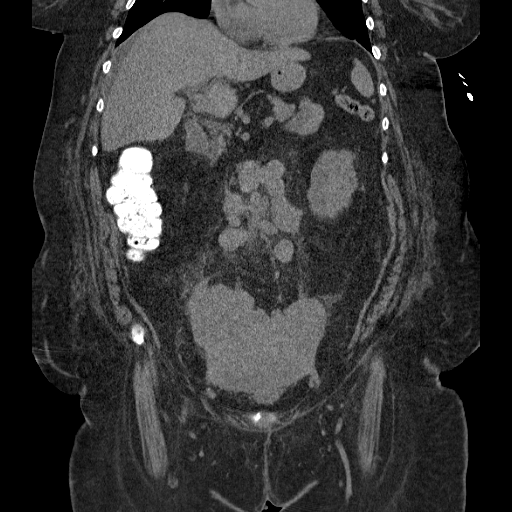
[im 98/177  soft-tissue]
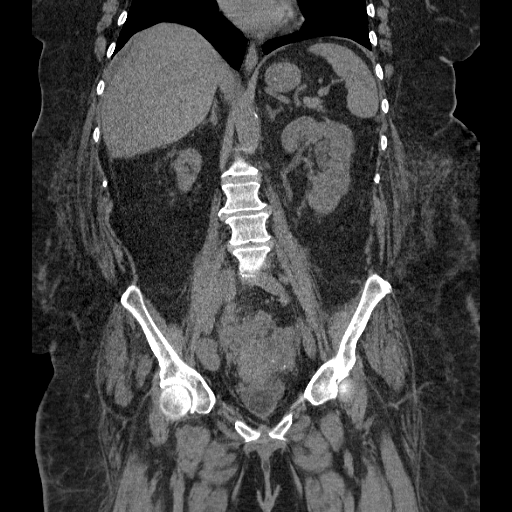

[15 of 46 positions shown; findings below may reference images not displayed]

FINDINGS: Lower chest: 9 x 7 mm pleural-based nodule in the right lower lobe
on sequence 4, image 12. This is new from the recent comparison CT.
Enlargement of the nodule at the right lung base measuring 8 mm on
sequence 4, image 34 and previously measured 6 mm. Nodule in the
medial left lower lobe on sequence 4, image 18 measures 9 x 9 mm and
previously measured up to 7 mm. Additional nodules in the medial
right lung base have enlarged. No significant pleural fluid.
Enlarging nodule in left lower lobe on image 10.

Hepatobiliary: New perihepatic ascites. Evaluation of liver is
limited without intravenous contrast. However, there is a subtle
area of low density along the medial right hepatic lobe on sequence
2, image 20. Findings raise concern for an underlying lesion. Cannot
exclude another subtle low-density area in the liver on image 18.
Gallbladder has been removed. No significant biliary dilatation.

Pancreas: Normal appearance of the pancreas without inflammation or
duct dilatation.

Spleen: Normal appearance of spleen without enlargement.

Adrenals/Urinary Tract: Normal adrenal glands. Chronic atrophy of
the right kidney. Again noted is fullness of the left renal
collecting system. Minimal change from the prior CT. Left ureter is
difficult to follow but there is mild dilatation of the left ureter
and there may be a transition point in the pelvis related to the
pelvic mass.

Stomach/Bowel: High-density contrast in the colon. No evidence for
bowel obstruction.

Vascular/Lymphatic: Few atherosclerotic calcifications in the
abdominal aorta. No evidence for an aneurysm. Again noted is
extensive retroperitoneal lymphadenopathy surrounding the aorta and
inferior vena cava. Left aortic lymph node on sequence 2, image 45
measures 4.1 x 3.4 cm, previously measured 3.4 x 3.2 cm. Index node
in the right iliac region measures 2.6 cm in the short axis on
sequence 2, image 57 and previously measured 2.4 cm. Large lymph
node along the right pelvic sidewall measures 2.5 cm in the short
axis and previously measured 2.3 cm. Enlarged periumbilical nodule.
This is suggestive for Egord Percembli nodule which measures
2.9 x 2.5 cm. Left iliac lymph node on sequence 2, image 72 measures
2.4 cm in the short axis and previously measured 2.3 cm.

Reproductive: Again noted is a large, poorly defined mass in the
expected location of the uterus. Mass roughly measures 15.1 cm in
transverse dimension and measures 10.8 cm in the AP dimension. The
mass itself has not significantly changed from the prior CT.

Other: Small amount of perihepatic fluid is new. There appears to be
a small amount of fluid in the cul-de-sac. Again noted are scattered
peritoneal implants. Largest peritoneal lesion is in the anterior
abdomen measuring 3.4 x 4.4 cm and previously measured 2.9 x 3.5 cm.

Musculoskeletal: No acute abnormality.
IMPRESSION: Progression of metastatic disease. New and enlarging pulmonary
nodules. Enlarging abdominal and pelvic lymphadenopathy and
enlargement of peritoneal implants. Concern for hepatic lesions.
Again noted is a large, poorly defined pelvic mass which may be
involving the uterus and adnexal structures. Findings are suspicious
for primary GYN malignancy. The periumbilical nodule would be the
most accessible lesion for tissue sampling if needed.

Persistent mild left hydroureteronephrosis which appears to be
secondary to the pelvic mass and lymphadenopathy. Consider renal
decompression with a ureter stent or nephrostomy tube because the
patient has essentially a solitary kidney. The right kidney is
atrophic.

These results will be called to the ordering clinician or
representative by the Radiologist Assistant, and communication
documented in the PACS or zVision Dashboard.
